# Patient Record
Sex: Female | Born: 1965 | Race: White | Hispanic: No | Marital: Single | Smoking: Never smoker
Health system: Southern US, Community
[De-identification: ages and names within clinical notes are randomized; demographics above are authoritative.]

## PROBLEM LIST (undated history)

## (undated) DIAGNOSIS — R202 Paresthesia of skin: Secondary | ICD-10-CM

## (undated) DIAGNOSIS — R52 Pain, unspecified: Secondary | ICD-10-CM

## (undated) DIAGNOSIS — F329 Major depressive disorder, single episode, unspecified: Secondary | ICD-10-CM

## (undated) DIAGNOSIS — F32A Depression, unspecified: Secondary | ICD-10-CM

## (undated) DIAGNOSIS — E079 Disorder of thyroid, unspecified: Secondary | ICD-10-CM

## (undated) DIAGNOSIS — R200 Anesthesia of skin: Secondary | ICD-10-CM

## (undated) HISTORY — DX: Depression, unspecified: F32.A

## (undated) HISTORY — DX: Paresthesia of skin: R20.0

## (undated) HISTORY — DX: Disorder of thyroid, unspecified: E07.9

## (undated) HISTORY — PX: ABDOMINAL HYSTERECTOMY: SHX81

## (undated) HISTORY — DX: Pain, unspecified: R52

## (undated) HISTORY — DX: Paresthesia of skin: R20.2

## (undated) HISTORY — DX: Major depressive disorder, single episode, unspecified: F32.9

---

## 1998-02-16 ENCOUNTER — Other Ambulatory Visit: Admission: RE | Admit: 1998-02-16 | Discharge: 1998-02-16 | Payer: Self-pay | Admitting: Obstetrics and Gynecology

## 1998-03-18 ENCOUNTER — Other Ambulatory Visit: Admission: RE | Admit: 1998-03-18 | Discharge: 1998-03-18 | Payer: Self-pay | Admitting: Obstetrics and Gynecology

## 1999-05-30 ENCOUNTER — Other Ambulatory Visit: Admission: RE | Admit: 1999-05-30 | Discharge: 1999-05-30 | Payer: Self-pay | Admitting: Obstetrics and Gynecology

## 2000-07-23 ENCOUNTER — Encounter: Payer: Self-pay | Admitting: Emergency Medicine

## 2000-07-23 ENCOUNTER — Emergency Department (HOSPITAL_COMMUNITY): Admission: EM | Admit: 2000-07-23 | Discharge: 2000-07-23 | Payer: Self-pay | Admitting: Emergency Medicine

## 2001-11-25 ENCOUNTER — Encounter: Admission: RE | Admit: 2001-11-25 | Discharge: 2001-11-25 | Payer: Self-pay | Admitting: Family Medicine

## 2001-11-25 ENCOUNTER — Encounter: Payer: Self-pay | Admitting: Family Medicine

## 2002-09-17 ENCOUNTER — Other Ambulatory Visit: Admission: RE | Admit: 2002-09-17 | Discharge: 2002-09-17 | Payer: Self-pay | Admitting: Family Medicine

## 2003-12-04 ENCOUNTER — Other Ambulatory Visit: Admission: RE | Admit: 2003-12-04 | Discharge: 2003-12-04 | Payer: Self-pay | Admitting: Internal Medicine

## 2004-12-30 ENCOUNTER — Other Ambulatory Visit: Admission: RE | Admit: 2004-12-30 | Discharge: 2004-12-30 | Payer: Self-pay | Admitting: Obstetrics and Gynecology

## 2005-02-24 ENCOUNTER — Ambulatory Visit (HOSPITAL_COMMUNITY): Admission: RE | Admit: 2005-02-24 | Discharge: 2005-02-24 | Payer: Self-pay | Admitting: Obstetrics and Gynecology

## 2005-02-24 ENCOUNTER — Ambulatory Visit (HOSPITAL_BASED_OUTPATIENT_CLINIC_OR_DEPARTMENT_OTHER): Admission: RE | Admit: 2005-02-24 | Discharge: 2005-02-24 | Payer: Self-pay | Admitting: Obstetrics and Gynecology

## 2005-04-10 ENCOUNTER — Ambulatory Visit (HOSPITAL_COMMUNITY): Admission: RE | Admit: 2005-04-10 | Discharge: 2005-04-10 | Payer: Self-pay | Admitting: Obstetrics and Gynecology

## 2005-04-10 ENCOUNTER — Ambulatory Visit (HOSPITAL_BASED_OUTPATIENT_CLINIC_OR_DEPARTMENT_OTHER): Admission: RE | Admit: 2005-04-10 | Discharge: 2005-04-10 | Payer: Self-pay | Admitting: Obstetrics and Gynecology

## 2005-05-30 ENCOUNTER — Ambulatory Visit (HOSPITAL_COMMUNITY): Admission: RE | Admit: 2005-05-30 | Discharge: 2005-05-30 | Payer: Self-pay | Admitting: Obstetrics and Gynecology

## 2005-05-30 ENCOUNTER — Ambulatory Visit (HOSPITAL_BASED_OUTPATIENT_CLINIC_OR_DEPARTMENT_OTHER): Admission: RE | Admit: 2005-05-30 | Discharge: 2005-05-30 | Payer: Self-pay | Admitting: Obstetrics and Gynecology

## 2005-08-17 ENCOUNTER — Ambulatory Visit (HOSPITAL_COMMUNITY): Admission: RE | Admit: 2005-08-17 | Discharge: 2005-08-17 | Payer: Self-pay | Admitting: Obstetrics and Gynecology

## 2005-09-29 ENCOUNTER — Ambulatory Visit (HOSPITAL_COMMUNITY): Admission: RE | Admit: 2005-09-29 | Discharge: 2005-09-29 | Payer: Self-pay | Admitting: Obstetrics and Gynecology

## 2005-09-29 ENCOUNTER — Ambulatory Visit (HOSPITAL_BASED_OUTPATIENT_CLINIC_OR_DEPARTMENT_OTHER): Admission: RE | Admit: 2005-09-29 | Discharge: 2005-09-29 | Payer: Self-pay | Admitting: Obstetrics and Gynecology

## 2006-01-26 ENCOUNTER — Other Ambulatory Visit: Admission: RE | Admit: 2006-01-26 | Discharge: 2006-01-26 | Payer: Self-pay | Admitting: Obstetrics and Gynecology

## 2006-05-21 ENCOUNTER — Other Ambulatory Visit: Admission: RE | Admit: 2006-05-21 | Discharge: 2006-05-21 | Payer: Self-pay | Admitting: Obstetrics & Gynecology

## 2007-03-25 ENCOUNTER — Other Ambulatory Visit: Admission: RE | Admit: 2007-03-25 | Discharge: 2007-03-25 | Payer: Self-pay | Admitting: Obstetrics and Gynecology

## 2008-02-17 ENCOUNTER — Emergency Department (HOSPITAL_COMMUNITY): Admission: EM | Admit: 2008-02-17 | Discharge: 2008-02-17 | Payer: Self-pay | Admitting: Emergency Medicine

## 2008-02-27 ENCOUNTER — Emergency Department (HOSPITAL_COMMUNITY): Admission: EM | Admit: 2008-02-27 | Discharge: 2008-02-27 | Payer: Self-pay | Admitting: Family Medicine

## 2008-05-29 ENCOUNTER — Emergency Department (HOSPITAL_COMMUNITY): Admission: EM | Admit: 2008-05-29 | Discharge: 2008-05-29 | Payer: Self-pay | Admitting: Emergency Medicine

## 2009-06-15 ENCOUNTER — Emergency Department (HOSPITAL_COMMUNITY): Admission: EM | Admit: 2009-06-15 | Discharge: 2009-06-16 | Payer: Self-pay | Admitting: Emergency Medicine

## 2010-05-18 ENCOUNTER — Emergency Department (HOSPITAL_COMMUNITY): Admission: EM | Admit: 2010-05-18 | Discharge: 2010-05-18 | Payer: Self-pay | Admitting: Emergency Medicine

## 2010-05-20 ENCOUNTER — Emergency Department (HOSPITAL_COMMUNITY): Admission: EM | Admit: 2010-05-20 | Discharge: 2010-05-20 | Payer: Self-pay | Admitting: Emergency Medicine

## 2010-06-04 ENCOUNTER — Inpatient Hospital Stay (HOSPITAL_COMMUNITY): Admission: AD | Admit: 2010-06-04 | Discharge: 2010-06-04 | Payer: Self-pay | Admitting: Obstetrics and Gynecology

## 2010-06-05 ENCOUNTER — Ambulatory Visit: Payer: Self-pay | Admitting: Gynecology

## 2010-11-05 ENCOUNTER — Encounter: Payer: Self-pay | Admitting: Obstetrics and Gynecology

## 2010-12-30 LAB — URINALYSIS, ROUTINE W REFLEX MICROSCOPIC
Bilirubin Urine: NEGATIVE
Glucose, UA: NEGATIVE mg/dL
Glucose, UA: NEGATIVE mg/dL
Hgb urine dipstick: NEGATIVE
Ketones, ur: NEGATIVE mg/dL
Ketones, ur: NEGATIVE mg/dL
Leukocytes, UA: NEGATIVE
Nitrite: NEGATIVE
Protein, ur: NEGATIVE mg/dL
Protein, ur: NEGATIVE mg/dL
Specific Gravity, Urine: 1.018 (ref 1.005–1.030)
Urobilinogen, UA: 0.2 mg/dL (ref 0.0–1.0)
Urobilinogen, UA: 0.2 mg/dL (ref 0.0–1.0)
pH: 6 (ref 5.0–8.0)

## 2010-12-30 LAB — LIPASE, BLOOD: Lipase: 31 U/L (ref 11–59)

## 2010-12-30 LAB — DIFFERENTIAL
Basophils Absolute: 0 K/uL (ref 0.0–0.1)
Basophils Relative: 0 % (ref 0–1)
Basophils Relative: 1 % (ref 0–1)
Eosinophils Absolute: 0.1 10*3/uL (ref 0.0–0.7)
Eosinophils Relative: 1 % (ref 0–5)
Eosinophils Relative: 1 % (ref 0–5)
Lymphocytes Relative: 23 % (ref 12–46)
Lymphocytes Relative: 27 % (ref 12–46)
Lymphs Abs: 2.9 10*3/uL (ref 0.7–4.0)
Lymphs Abs: 3 10*3/uL (ref 0.7–4.0)
Monocytes Absolute: 0.7 K/uL (ref 0.1–1.0)
Monocytes Relative: 6 % (ref 3–12)
Neutro Abs: 7.2 10*3/uL (ref 1.7–7.7)
Neutrophils Relative %: 66 % (ref 43–77)
Neutrophils Relative %: 71 % (ref 43–77)

## 2010-12-30 LAB — CBC
HCT: 36.7 % (ref 36.0–46.0)
HCT: 37.9 % (ref 36.0–46.0)
Hemoglobin: 12.9 g/dL (ref 12.0–15.0)
Hemoglobin: 13.1 g/dL (ref 12.0–15.0)
MCH: 31.5 pg (ref 26.0–34.0)
MCH: 31.8 pg (ref 26.0–34.0)
MCHC: 35.2 g/dL (ref 30.0–36.0)
MCV: 90.4 fL (ref 78.0–100.0)
MCV: 91.6 fL (ref 78.0–100.0)
Platelets: 295 K/uL (ref 150–400)
RBC: 4.06 MIL/uL (ref 3.87–5.11)
RDW: 13.9 % (ref 11.5–15.5)
WBC: 10.9 K/uL — ABNORMAL HIGH (ref 4.0–10.5)

## 2010-12-30 LAB — URINE CULTURE
Colony Count: 50000
Culture  Setup Time: 201108040246

## 2010-12-30 LAB — COMPREHENSIVE METABOLIC PANEL
Albumin: 4 g/dL (ref 3.5–5.2)
Albumin: 4.3 g/dL (ref 3.5–5.2)
Alkaline Phosphatase: 69 U/L (ref 39–117)
Alkaline Phosphatase: 73 U/L (ref 39–117)
BUN: 12 mg/dL (ref 6–23)
BUN: 14 mg/dL (ref 6–23)
CO2: 23 mEq/L (ref 19–32)
CO2: 28 mEq/L (ref 19–32)
Calcium: 9.2 mg/dL (ref 8.4–10.5)
Calcium: 9.3 mg/dL (ref 8.4–10.5)
Chloride: 104 mEq/L (ref 96–112)
Chloride: 104 mEq/L (ref 96–112)
Creatinine, Ser: 0.76 mg/dL (ref 0.4–1.2)
Creatinine, Ser: 0.8 mg/dL (ref 0.4–1.2)
GFR calc Af Amer: >60 mL/min (ref >60)
GFR calc non Af Amer: >60 mL/min (ref >60)
Potassium: 3.8 mEq/L (ref 3.5–5.1)
Potassium: 4 mEq/L (ref 3.5–5.1)
Sodium: 137 mEq/L (ref 135–145)
Sodium: 138 mEq/L (ref 135–145)
Total Protein: 7.4 g/dL (ref 6.0–8.3)

## 2010-12-30 LAB — COMPREHENSIVE METABOLIC PANEL WITH GFR
ALT: 36 U/L — ABNORMAL HIGH (ref 0–35)
AST: 32 U/L (ref 0–37)
GFR calc Af Amer: >60 mL/min (ref >60)
Glucose, Bld: 83 mg/dL (ref 70–99)
Total Bilirubin: 0.6 mg/dL (ref 0.3–1.2)

## 2010-12-30 LAB — POCT PREGNANCY, URINE: Preg Test, Ur: NEGATIVE

## 2011-03-03 NOTE — Op Note (Signed)
NAME:  [name], [name] NO.:  [id number]   MEDICAL RECORD NO.:  [id number]          PATIENT TYPE:  AMB   LOCATION:  [facility]                         FACILITY:  [facility]   PHYSICIAN:  Cynthia P. Romine, M.D.DATE OF BIRTH:  13-Dec-1965   DATE OF PROCEDURE:  02/24/2005  DATE OF DISCHARGE:                                 OPERATIVE REPORT   PREOPERATIVE DIAGNOSES:  Menorrhagia.   POSTOPERATIVE DIAGNOSES:  Menorrhagia.   PROCEDURE:  Hysteroscopy attempt at HTA unsuccessful.   SURGEON:  Cynthia P. Romine, M.D.   ANESTHESIA:  General by LMA.   ESTIMATED BLOOD LOSS:  25 mL.   COMPLICATIONS:  None.   DESCRIPTION OF PROCEDURE:  The patient was taken to the operating room and  after the induction of adequate general anesthesia was placed in the dorsal  lithotomy position and prepped and draped in the usual fashion. The bladder  was drained with a red rubber catheter. A posterior weighted and anterior  Sims retractor were placed and the cervix was grasped off the anterior lip  with a single tooth tenaculum. The sound wound not pass, it was felt the os  was stenotic. The os finders were used and were passed into the uterus to a  depth of 6 cm. On ultrasound, the uterus had had a length of a total of 7 cm  so it was felt this was an appropriate distance into the uterus to be the  endometrial canal. The opening was dilated to a #25 Shawnie Pons. The hysteroscope  was introduced and the hysteroscope would not pass through the dilated  opening. The hysteroscope was removed, the opening was dilated to a #27, the  hysteroscope was reintroduced. At this time, it could be seen that the  surgeon had created a false passage in the uterus and when the hysteroscope  was introduced to the part of the uterus that had been opened, it was not  the endometrial canal. The hysteroscope was withdrawn to just inside the  external os and it appeared that there were actually two false passages.  Passing the  scope through either one of them did not achieve entry into a  recognizable endometrial cavity. An approximately 30 minute attempt was made  to find the internal cervical os so that the procedure could be accomplished  successfully, however, all attempts were unsuccessful and the surgeon was  never able to enter a recognizable endometrial cavity. Therefore the  procedure was abandoned, the instruments were removed from the vagina and  the procedure was terminated. The patient was taken to the recovery room in  satisfactory condition.      CPR/MEDQ  D:  02/24/2005  T:  02/24/2005  Job:  [id number]

## 2011-03-03 NOTE — Op Note (Signed)
NAME:  Tracie Gonzalez, Tracie Gonzalez NO.:  [id number]   MEDICAL RECORD NO.:  [id number]          PATIENT TYPE:  AMB   LOCATION:  [facility]                         FACILITY:  [facility]   PHYSICIAN:  Cynthia P. Romine, M.D.DATE OF BIRTH:  10-10-1966   DATE OF PROCEDURE:  09/29/2005  DATE OF DISCHARGE:                                 OPERATIVE REPORT   PREOPERATIVE DIAGNOSIS:  Menorrhagia.   POSTOPERATIVE DIAGNOSIS:  Menorrhagia.   PROCEDURE:  Endometrial ablation using the HydroTherm ablator.   SURGEON:  Dr. Arline Asp Romine   ASSISTANT:  None.   ANESTHESIA:  General by LMA.   ESTIMATED BLOOD LOSS:  Minimal.   COMPLICATIONS:  None.   PROCEDURE:  The patient was taken to the operating room and after the  induction of adequate general anesthesia, was placed in the dorsal lithotomy  position.  She had forgotten to take her Cytotec tablet the night before  surgery as directed.  Therefore in order to try and make her cervix easier  to dilate, it was injected with a solution of 10 mL of Pitressin in 10 mL of  saline, and a total of 7.5 mL of that solution was injected into the stroma  of the cervix.  The cervix was then very gently dilated to a #23 Shawnie Pons.  The  scope was introduced, and the endometrial cavity could be seen.  She had an  extremely small endometrial cavity.  No perforation was noted.  Photographic  documentation was taken.  Endometrial ablation was then carried out in the  usual fashion.  Approximately 2 minutes before completion of the procedure,  the machine shut down saying there was a 10 mL loss of 4 mL per minute.  This was felt to be due to expansion of the cavity.  The procedure was  continued.  Upon completing of the procedure, the endometrial cavity was  inspected.  It appeared to have gotten a good ablation.  No perforation was  encountered.  The scope was withdrawn.  The instruments were removed from  the vagina, and the procedure was terminated.  The patient  tolerated it well  and went in satisfactory condition to post anesthesia recovery.           ______________________________  Edwena Felty. Romine, M.D.     CPR/MEDQ  D:  09/29/2005  T:  09/30/2005  Job:  [id number]

## 2011-03-03 NOTE — Op Note (Signed)
NAME:  Tracie Gonzalez, Tracie Gonzalez NO.:  [id number]   MEDICAL RECORD NO.:  [id number]          PATIENT TYPE:  AMB   LOCATION:  [facility]                         FACILITY:  [facility]   PHYSICIAN:  Cynthia P. Romine, M.D.DATE OF BIRTH:  May 23, 1966   DATE OF PROCEDURE:  04/10/2005  DATE OF DISCHARGE:                                 OPERATIVE REPORT   PREOPERATIVE DIAGNOSIS:  Menorrhagia.   POSTOPERATIVE DIAGNOSIS:  Menorrhagia.   PROCEDURE:  Attempt at hydrotherm ablation, complicated by uterine  perforation.   SURGEON:  Cynthia P. Romine, M.D.   ANESTHESIA:  General by LMA.   ESTIMATED BLOOD LOSS:  Minimal.   COMPLICATIONS:  Uterine perforation fundally.   DESCRIPTION OF PROCEDURE:  The patient was taken to the operating room and  after the induction of adequate general anesthesia by LMA, was placed in the  dorsal lithotomy position and prepped and draped in the usual fashion.  The  bladder was drained with a red rubber catheter.  A posterior weighted and  anterior Sims retractor were placed.  The cervix was grasped on the anterior  lip with a single tooth tenaculum.  The patient had used 400 mcg of Cytotec  last night, in an attempt to soften her cervix, since a previous attempt at  HTA had failed, secondary to the inability to enter the cervix because of  cervical stenosis.  Because of the history of cervical stenosis, in addition  to using the Cytotec, I injected approximately 60 mL of a solution of 20  units of Pitressin and 20 units of sterile saline into the stroma of the  ce4rvix, to hopefully allow further softening of the cervix.  The #13 Shawnie Pons  was gently introduced and was noted to pass through the cervix without  difficulty.  The cervix was dilated to a #21 Shawnie Pons.  The hysteroscope that  was used for the HTA was then inserted into the endo-cervix and the fit of  the cervix around this scope was quite snug; however, because I felt that  the cervix was likely to be  softened from the Cytotec and the Pitressin, I  did not want to risk having a leakage of hot fluid around the scope.  Therefore I was happy with the tightness of the fit of the scope.  I watched  the scope as the scope proceeded through the endo-cervical canal, but with  the pressure that was needed to pass the scope through the tight cervix,  once it passed through the endo-cervical canal, it went into the endometrial  cavity and then perforated at the fundus.  Bowel and omentum were seen.  The  procedure was immediately stopped.  The scope was withdrawn.  The opening in  the uterus did not appear to be bleeding at all.  The scope was completely  withdrawn from the uterus and the patient was observed for several  minutes, to see if there was any bleeding from the cervix, which there was  not.  The instruments were removed from the vagina.  The procedure was  terminated.   The patient was taken  to the recovery room in satisfactory condition.       CPR/MEDQ  D:  04/10/2005  T:  04/10/2005  Job:  [id number]

## 2011-03-03 NOTE — Op Note (Signed)
NAME:  Tracie Gonzalez, Tracie Gonzalez NO.:  [id number]   MEDICAL RECORD NO.:  [id number]          PATIENT TYPE:  AMB   LOCATION:  [facility]                         FACILITY:  [facility]   PHYSICIAN:  Cynthia P. Romine, M.D.DATE OF BIRTH:  [birth year]   DATE OF PROCEDURE:  05/30/2005  DATE OF DISCHARGE:                                 OPERATIVE REPORT   PREOPERATIVE DIAGNOSIS:  Menorrhagia.   POSTOPERATIVE DIAGNOSIS:  Menorrhagia.   PROCEDURE:  Failed endometrial ablation.   SURGEON:  Cynthia P. Romine, M.D.   ANESTHESIA:  General by LMA.   ESTIMATED BLOOD LOSS:  None.   COMPLICATIONS:  Fundal perforation.   DESCRIPTION OF PROCEDURE:  The patient was taken to the operating room and  after induction of adequate general anesthesia by LMA, was placed in the  dorsal lithotomy position and prepped and draped in the usual fashion.  She  had voided immediately before surgery, therefore, catheterization was not  done.  The posterior weighted and anterior Sims retractors were placed.  The  uterus was known to be severely anteflexed, so the cervix was grasped on the  posterior lip with the single tooth tenaculum and traction was applied to  straighten the axis of the uterus.  A #13 Pratt dilator was coated with KY  Jelly and very slowly and very gently inserted through the cervix into the  uterus.  As it was inserted into the uterus, it was noted to pass deeply  into the uterus and it was felt that perforation was present.  The cervix  was gently dilated to a #23 Shawnie Pons just so the scope could be introduced.  The hysteroscope was introduced, water was used for a distention medium, and  indeed, at the fundus there was a perforation.  It was not bleeding.  It is  difficult for the operator to imagine that the perforation occurred in the  current setting as slowly and gently as the dilator was introduced, and it  is certainly possible that the perforation had not healed from her previous  attempt  at HEA which was six weeks ago.  The procedure was abandoned, the  instruments were removed from the vagina, and the patient was taken to the  recovery room in good condition.           ______________________________  Edwena Felty. Romine, M.D.     CPR/MEDQ  D:  05/30/2005  T:  05/30/2005  Job:  [id number]

## 2012-02-25 ENCOUNTER — Emergency Department (HOSPITAL_COMMUNITY)
Admission: EM | Admit: 2012-02-25 | Discharge: 2012-02-25 | Disposition: A | Discharge status: Home / Self Care | Payer: BC Managed Care – PPO | Attending: Emergency Medicine | Admitting: Emergency Medicine

## 2012-02-25 ENCOUNTER — Encounter (HOSPITAL_COMMUNITY): Payer: Self-pay | Admitting: Emergency Medicine

## 2012-02-25 DIAGNOSIS — M5430 Sciatica, unspecified side: Secondary | ICD-10-CM | POA: Insufficient documentation

## 2012-02-25 DIAGNOSIS — M5431 Sciatica, right side: Secondary | ICD-10-CM

## 2012-02-25 MED ORDER — OXYCODONE-ACETAMINOPHEN 5-325 MG PO TABS
1.0000 | ORAL_TABLET | Freq: Once | ORAL | Status: AC
  Administered 2012-02-25: 1 via ORAL
  Filled 2012-02-25: qty 1

## 2012-02-25 MED ORDER — HYDROCODONE-ACETAMINOPHEN 5-500 MG PO TABS: 1.0000 | ORAL_TABLET | Freq: Four times a day (QID) | ORAL | Status: DC | PRN

## 2012-02-25 MED ORDER — IBUPROFEN 800 MG PO TABS: 800.0000 mg | ORAL_TABLET | Freq: Three times a day (TID) | ORAL | Status: AC

## 2012-02-25 MED ORDER — IBUPROFEN 800 MG PO TABS
800.0000 mg | ORAL_TABLET | Freq: Once | ORAL | Status: AC
  Administered 2012-02-25: 800 mg via ORAL
  Filled 2012-02-25: qty 1

## 2012-02-25 NOTE — ED Notes (Signed)
Pt denies injury. States pain running down right leg into foot. Pain in lower back.

## 2012-02-25 NOTE — Discharge Instructions (Signed)
Take ibuprofen as directed for inflammation and pain and use hydrocodone-acetaminophen as directed for breakthrough pain but do not drive or operate machinery with pain medication use. Heat for additional relief. Follow up with your primary care provider for recheck of ongoing symptoms in 3-5 days but return to ER for emergent changing or worsening of symptoms.

## 2012-02-25 NOTE — ED Provider Notes (Signed)
History     CSN: [id number]  Arrival date & time 02/25/12  7829   First MD Initiated Contact with Patient 02/25/12 2041      Chief Complaint  Patient presents with  . Back Pain    (Consider location/radiation/quality/duration/timing/severity/associated sxs/prior treatment) HPI  Patient presents to ER complaining of gradual onset right lower back pain with radiation of pain into right hip and leg that began yesterday and has persisted throughout the day today. Patient states that 2 days ago she sat in a hard chair for hours but did not have pain at that time. She states that she woke yesterday with mild pain that is aggravated by prolonged sitting or standing. She denies lower extremity numbness/tingling/weakness, loss of bowel or bladder function or saddle seat paresthesias. She denies fevers, chills, abdominal pain, dysuria, hematuria, blood in stool or difficulty ambulating.   History reviewed. No pertinent past medical history.  Past Surgical History  Procedure Date  . Abdominal hysterectomy     No family history on file.  History  Substance Use Topics  . Smoking status: Never Smoker   . Smokeless tobacco: Not on file  . Alcohol Use: No    OB History    Grav Para Term Preterm Abortions TAB SAB Ect Mult Living                  Review of Systems  All other systems reviewed and are negative.    Allergies  Review of patient's allergies indicates no known allergies.  Home Medications   Current Outpatient Rx  Name Route Sig Dispense Refill  . AMPHETAMINE-DEXTROAMPHETAMINE 30 MG PO TABS Oral Take 30 mg by mouth daily.    . ASPIRIN 325 MG PO TABS Oral Take 325 mg by mouth daily.    . IBUPROFEN 400 MG PO TABS Oral Take 400 mg by mouth every 6 (six) hours as needed. For pain relief    . THYROID 90 MG PO TABS Oral Take 90 mg by mouth daily.    Marland Kitchen HYDROCODONE-ACETAMINOPHEN 5-500 MG PO TABS Oral Take 1-2 tablets by mouth every 6 (six) hours as needed for pain. 15 tablet  0  . IBUPROFEN 800 MG PO TABS Oral Take 1 tablet (800 mg total) by mouth 3 (three) times daily. Take 800mg  by mouth at breakfast, lunch and dinner for the next 5 days 21 tablet 0    BP 133/74  Pulse 84  Temp(Src) 98 F (36.7 C) (Oral)  Resp 16  Wt 175 lb (79.379 kg)  SpO2 99%  Physical Exam  Nursing note and vitals reviewed. Constitutional: She is oriented to person, place, and time. She appears well-developed and well-nourished. No distress.  HENT:  Head: Normocephalic and atraumatic.  Eyes: Conjunctivae are normal.  Neck: Normal range of motion. Neck supple.  Cardiovascular: Normal rate, regular rhythm, normal heart sounds and intact distal pulses.  Exam reveals no gallop and no friction rub.   No murmur heard. Pulmonary/Chest: Effort normal and breath sounds normal. No respiratory distress. She has no wheezes. She has no rales. She exhibits no tenderness.  Abdominal: Soft. Bowel sounds are normal. She exhibits no distension and no mass. There is no tenderness. There is no rebound and no guarding.  Musculoskeletal: Normal range of motion. She exhibits tenderness. She exhibits no edema.       Mild TTP of right lower back without skin changes or crepitous. FROM of bilateral LE with 5/5 strength and normal reflexes.   Neurological: She is  alert and oriented to person, place, and time.  Skin: Skin is warm and dry. No rash noted. She is not diaphoretic. No erythema.  Psychiatric: She has a normal mood and affect.    ED Course  Procedures (including critical care time)  PO percocet and ibuprofen.   Labs Reviewed - No data to display No results found.   1. Sciatica neuralgia, right       MDM  No red flags for back pain with no signs or symptoms of cauda equina. No abdominal pain, fevers, chills or urinary symptoms.         Jenness Corner, [place] 02/25/12 2115

## 2012-02-25 NOTE — ED Provider Notes (Signed)
Medical screening examination/treatment/procedure(s) were performed by non-physician practitioner and as supervising physician I was immediately available for consultation/collaboration. Devoria Albe, MD, Armando Gang   Ward Givens, MD 02/25/12 [phone]

## 2012-02-25 NOTE — ED Notes (Signed)
Pt alert and ambulates without distress 

## 2012-02-25 NOTE — ED Notes (Signed)
Pt alert, nad, c/o low back pain, onset last evening, denies trauma or injury, ambulates to triage, steady gait noted,denies changes in bowel or bladder

## 2012-02-28 ENCOUNTER — Ambulatory Visit (INDEPENDENT_AMBULATORY_CARE_PROVIDER_SITE_OTHER): Payer: BC Managed Care – PPO | Admitting: Family Medicine

## 2012-02-28 ENCOUNTER — Encounter: Payer: Self-pay | Admitting: Family Medicine

## 2012-02-28 ENCOUNTER — Ambulatory Visit: Payer: BC Managed Care – PPO

## 2012-02-28 VITALS — BP 127/89 | HR 98 | Temp 98.7°F | Resp 18 | Ht 60.5 in | Wt 176.6 lb

## 2012-02-28 DIAGNOSIS — M545 Low back pain, unspecified: Secondary | ICD-10-CM

## 2012-02-28 MED ORDER — METHYLPREDNISOLONE 4 MG PO TABS: ORAL_TABLET | ORAL | Status: DC

## 2012-02-28 NOTE — Patient Instructions (Signed)

## 2012-02-28 NOTE — Progress Notes (Signed)
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 She started having pain after sitting down for prolonged period on Saturday. Last week she just embark on a weight training program and had been doing weight lifting.  Patient has had back pain in the past twice, once a year ago, 1 several months ago. In each time she resolved the problem within a couple days with ibuprofen. This is the first time she's had pain running down her leg over the anterior part of her quadriceps.  No problems with urination or defecation. Patient does get diaphoretic at times but has had no fever.  Patient is loathe to take cortisone. She's been on pain pills after going to the emergency room on Sunday and these have only been partially helpful. She's been unable to work this week. He says the pain is worse going up steps stairs and sitting. Cannot lie on anything but a hard floor  Objective: No acute distress the patient appears uncomfortable sitting.  Reflexes: Ankle jerks bilaterally normal, and knee jerks symmetric as well.  SLR: Positive on right  Distal pulses in feet are normal and there is no swelling in the feet., Calves are nontender  Skin: Warm and dry without rash  [facility] reading (PRIMARY) by  Dr. Milus Glazier  L-S spines. Old compression fx L1  Assessment: Sciatica, new onset probably related to activity last week and then prolonged sitting.  Plan: Medrol 4 mg 3-to-to-1-1 daily with food  Out of work this week  May continue her own hydrocodone  MRI tomorrow

## 2012-02-29 ENCOUNTER — Ambulatory Visit
Admission: RE | Admit: 2012-02-29 | Discharge: 2012-02-29 | Disposition: A | Discharge status: Home / Self Care | Payer: BC Managed Care – PPO | Source: Ambulatory Visit | Attending: Family Medicine | Admitting: Family Medicine

## 2012-02-29 DIAGNOSIS — M545 Low back pain: Secondary | ICD-10-CM

## 2012-03-03 ENCOUNTER — Other Ambulatory Visit: Payer: Self-pay | Admitting: Family Medicine

## 2012-03-03 MED ORDER — HYDROCODONE-ACETAMINOPHEN 5-500 MG PO TABS
1.0000 | ORAL_TABLET | Freq: Four times a day (QID) | ORAL | Status: AC | PRN
Start: 2012-03-03 — End: 2012-03-13

## 2012-03-10 ENCOUNTER — Ambulatory Visit (INDEPENDENT_AMBULATORY_CARE_PROVIDER_SITE_OTHER): Payer: BC Managed Care – PPO | Admitting: Family Medicine

## 2012-03-10 VITALS — BP 110/74 | HR 82 | Temp 98.1°F | Resp 16 | Ht 62.5 in | Wt 179.0 lb

## 2012-03-10 DIAGNOSIS — L259 Unspecified contact dermatitis, unspecified cause: Secondary | ICD-10-CM

## 2012-03-10 DIAGNOSIS — L710 Perioral dermatitis: Secondary | ICD-10-CM

## 2012-03-10 DIAGNOSIS — S32008A Other fracture of unspecified lumbar vertebra, initial encounter for closed fracture: Secondary | ICD-10-CM

## 2012-03-10 DIAGNOSIS — S34109A Unspecified injury to unspecified level of lumbar spinal cord, initial encounter: Secondary | ICD-10-CM

## 2012-03-10 DIAGNOSIS — IMO0002 Reserved for concepts with insufficient information to code with codable children: Secondary | ICD-10-CM

## 2012-03-10 MED ORDER — METRONIDAZOLE 1 % EX GEL: Freq: Every day | CUTANEOUS | Status: DC

## 2012-03-10 MED ORDER — METHYLPREDNISOLONE 4 MG PO TABS: ORAL_TABLET | ORAL | Status: DC

## 2012-03-10 NOTE — Progress Notes (Signed)
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  The pain is now 80% better.  She needs a 10 minute break every hour to continue her work.  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  It has worsened the last week O:  Several facial papules right perioral area. P:  metrogel

## 2012-03-17 ENCOUNTER — Encounter: Payer: Self-pay | Admitting: Family Medicine

## 2012-03-17 ENCOUNTER — Ambulatory Visit (INDEPENDENT_AMBULATORY_CARE_PROVIDER_SITE_OTHER): Payer: BC Managed Care – PPO | Admitting: Family Medicine

## 2012-03-17 VITALS — BP 135/89 | HR 80 | Temp 98.4°F | Resp 16 | Ht 62.0 in | Wt 179.2 lb

## 2012-03-17 DIAGNOSIS — S34109A Unspecified injury to unspecified level of lumbar spinal cord, initial encounter: Secondary | ICD-10-CM

## 2012-03-17 DIAGNOSIS — IMO0002 Reserved for concepts with insufficient information to code with codable children: Secondary | ICD-10-CM

## 2012-03-17 DIAGNOSIS — S32008A Other fracture of unspecified lumbar vertebra, initial encounter for closed fracture: Secondary | ICD-10-CM

## 2012-03-17 DIAGNOSIS — L710 Perioral dermatitis: Secondary | ICD-10-CM

## 2012-03-17 MED ORDER — GABAPENTIN 100 MG PO CAPS: 100.0000 mg | ORAL_CAPSULE | Freq: Every day | ORAL | Status: DC

## 2012-03-17 NOTE — Progress Notes (Signed)
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 The pain is now 90% better. She needs a 10 minute break every hour to continue her work.  O: Old film shows L1 compression fx  SLR neg  Diminished AJ bilaterally  Normal sensation legs  Motor: Normal  Able to flex torso 40 degrees and do side bends   A: Compression fracture: Improving P: Gabapentin 100 each bedtime Continue light duty but increased to 8 hours per day with a continued 10 minute rest in shower, start physical therapy 3 times a week for a month and recheck   Also patient has h/o perioral dermatitis which has dramatically improved the MetroGel. Sclerae happy with the loss of papules and erythema around her face. O: Several facial papules right perioral area which are almost nonexistent now. P: metrogel to continue.

## 2012-04-02 ENCOUNTER — Ambulatory Visit (INDEPENDENT_AMBULATORY_CARE_PROVIDER_SITE_OTHER): Payer: BC Managed Care – PPO | Admitting: Emergency Medicine

## 2012-04-02 VITALS — BP 115/76 | HR 80 | Temp 97.9°F | Resp 16 | Ht 62.0 in | Wt 175.8 lb

## 2012-04-02 DIAGNOSIS — IMO0002 Reserved for concepts with insufficient information to code with codable children: Secondary | ICD-10-CM
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
## 2012-05-08 ENCOUNTER — Ambulatory Visit (INDEPENDENT_AMBULATORY_CARE_PROVIDER_SITE_OTHER): Payer: BC Managed Care – PPO | Admitting: Family Medicine

## 2012-05-08 VITALS — BP 132/90 | HR 93 | Temp 98.0°F | Resp 16 | Ht 62.0 in | Wt 172.0 lb

## 2012-05-08 DIAGNOSIS — Z733 Stress, not elsewhere classified: Secondary | ICD-10-CM

## 2012-05-08 DIAGNOSIS — F439 Reaction to severe stress, unspecified: Secondary | ICD-10-CM

## 2012-05-08 DIAGNOSIS — M545 Low back pain, unspecified: Secondary | ICD-10-CM
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
## 2012-05-24 ENCOUNTER — Encounter: Payer: Self-pay | Admitting: Family Medicine

## 2012-05-24 ENCOUNTER — Ambulatory Visit (INDEPENDENT_AMBULATORY_CARE_PROVIDER_SITE_OTHER): Payer: BC Managed Care – PPO | Admitting: Family Medicine

## 2012-05-24 VITALS — BP 118/76 | HR 65 | Temp 98.3°F | Resp 16 | Ht 62.5 in | Wt 175.0 lb

## 2012-05-24 DIAGNOSIS — M8920 Other disorders of bone development and growth, unspecified site: Secondary | ICD-10-CM

## 2012-05-24 DIAGNOSIS — M8580 Other specified disorders of bone density and structure, unspecified site: Secondary | ICD-10-CM

## 2012-05-24 DIAGNOSIS — M545 Low back pain, unspecified: Secondary | ICD-10-CM

## 2012-05-24 NOTE — Progress Notes (Signed)
46 yo with chronic back pain and recently had an MRI in [place].  She is here to review that recent MRI.  The MRI was performed about two weeks ago.   Overall, the pain is lessening.  It is centered in lower back and doesn't radiate into legs.  Objective:  NAD  SLR:  Negative Reflexes:  No asymmetry  Mild abnormalities seen on MRI faxed over from [place] [facility]  1. Low back pain  MR Lumbar Spine Wo Contrast, DG Bone Density  2. Osteopenia  MR Lumbar Spine Wo Contrast, DG Bone Density   Follow up in 1 month

## 2012-05-28 ENCOUNTER — Other Ambulatory Visit: Payer: Self-pay | Admitting: Family Medicine

## 2012-05-28 DIAGNOSIS — IMO0002 Reserved for concepts with insufficient information to code with codable children: Secondary | ICD-10-CM

## 2012-05-28 MED ORDER — GABAPENTIN 100 MG PO CAPS: 100.0000 mg | ORAL_CAPSULE | Freq: Every day | ORAL | Status: DC

## 2012-06-25 ENCOUNTER — Telehealth: Payer: Self-pay

## 2012-06-25 DIAGNOSIS — M8580 Other specified disorders of bone density and structure, unspecified site: Secondary | ICD-10-CM

## 2012-06-25 NOTE — Telephone Encounter (Signed)
Pt states that she has an appt with [facility] for a scan and would like a bone density added to it for same day on 08/01/12 at 930   Best number [phone]

## 2012-06-25 NOTE — Telephone Encounter (Signed)
Dr Milus Glazier, do you want to refer pt to [facility] for a bone density test on 08/01/12 when she goes in for a scan?

## 2012-06-25 NOTE — Telephone Encounter (Signed)
Yes, please schedule DXA scan

## 2012-06-26 NOTE — Telephone Encounter (Signed)
Notified pt that referral for bone density has been started.

## 2012-07-03 ENCOUNTER — Ambulatory Visit (INDEPENDENT_AMBULATORY_CARE_PROVIDER_SITE_OTHER): Payer: BC Managed Care – PPO | Admitting: Emergency Medicine

## 2012-07-03 VITALS — BP 132/82 | HR 88 | Temp 98.4°F | Resp 16 | Ht 62.0 in | Wt 177.0 lb

## 2012-07-03 DIAGNOSIS — F988 Other specified behavioral and emotional disorders with onset usually occurring in childhood and adolescence: Secondary | ICD-10-CM

## 2012-07-03 MED ORDER — AMPHETAMINE-DEXTROAMPHETAMINE 30 MG PO TABS: 30.0000 mg | ORAL_TABLET | Freq: Two times a day (BID) | ORAL | Status: DC

## 2012-07-03 MED ORDER — AMPHETAMINE-DEXTROAMPHETAMINE 30 MG PO TABS: 30.0000 mg | ORAL_TABLET | Freq: Every day | ORAL | Status: DC

## 2012-07-03 NOTE — Progress Notes (Signed)
   Date:  07/03/2012   Name:  Tracie Gonzalez   DOB:  October 11, 1966   MRN:  [id number] Gender: female Age: 46 y.o.  PCP:  No primary provider on file.    Chief Complaint: Medication Refill and referral request   History of Present Illness:  Tracie Gonzalez is a 46 y.o. pleasant patient who presents with the following:  Requests refill of adderall under treatment for ADD at [facility] physicians.  Records are not available as they have not yet been scanned into computer.  Tolerating medication well, sleeping and eating without difficulty.    There is no problem list on file for this patient.   No past medical history on file.  Past Surgical History  Procedure Date  . Abdominal hysterectomy     History  Substance Use Topics  . Smoking status: Never Smoker   . Smokeless tobacco: Not on file  . Alcohol Use: Yes     socially    No family history on file.  No Known Allergies  Medication list has been reviewed and updated.  Outpatient Prescriptions Prior to Visit  Medication Sig Dispense Refill  . amphetamine-dextroamphetamine (ADDERALL) 30 MG tablet Take 30 mg by mouth daily.      Marland Kitchen etodolac (LODINE) 400 MG tablet Take 400 mg by mouth 2 (two) times daily.      Marland Kitchen gabapentin (NEURONTIN) 100 MG capsule Take 1 capsule (100 mg total) by mouth at bedtime. NEEDS OFFICE VISIT  30 capsule  0  . metroNIDAZOLE (METROGEL) 1 % gel Apply topically daily.  45 g  0  . thyroid (ARMOUR) 90 MG tablet Take 90 mg by mouth daily.      . valACYclovir (VALTREX) 1000 MG tablet Take 1,000 mg by mouth as needed.      Marland Kitchen HYDROcodone-acetaminophen (VICODIN) 5-500 MG per tablet Take 1 tablet by mouth every 6 (six) hours as needed.      . methylPREDNISolone (MEDROL) 4 MG tablet 2 daily  6 tablet  0    Review of Systems:  As per HPI, otherwise negative.    Physical Examination: Filed Vitals:   07/03/12 1746  BP: 132/82  Pulse: 88  Temp: 98.4 F (36.9 C)  Resp: 16   Filed Vitals:   07/03/12 1746    Height: 5\' 2"  (1.575 m)  Weight: 177 lb (80.287 kg)   Body mass index is 32.37 kg/(m^2). Ideal Body Weight: Weight in (lb) to have BMI = 25: 136.4    GEN: WDWN, NAD, Non-toxic, Alert & Oriented x 3 HEENT: Atraumatic, Normocephalic.  Ears and Nose: No external deformity. EXTR: No clubbing/cyanosis/edema NEURO: Normal gait.  PSYCH: Normally interactive. Conversant. Not depressed or anxious appearing.  Calm demeanor.    Assessment and Plan: ADD Refill adderall for three months.  Next visit in December.  Carmelina Dane, MD  I have reviewed and agree with documentation. Robert P. Merla Riches, M.D.

## 2012-07-05 ENCOUNTER — Telehealth: Payer: Self-pay

## 2012-07-05 NOTE — Telephone Encounter (Signed)
Patient is requesting to have kykoplasty. Please call [phone] Patient does not care if surgery is in [place]. Patient also needs it done this year for insurance purposes.

## 2012-07-05 NOTE — Telephone Encounter (Signed)
Pt is wanting to talk with dr Milus Glazier about a new referral regarding her back  Best number [phone]

## 2012-07-07 NOTE — Telephone Encounter (Signed)
I won't be able to refer until I have more time to review the chart.  Where does she want referral?

## 2012-07-07 NOTE — Telephone Encounter (Signed)
Left message for patient to call back with the doctor's name where she wants to be referred. Also let her know that it will take a bit to review her chart.

## 2012-07-08 ENCOUNTER — Telehealth: Payer: Self-pay

## 2012-07-08 NOTE — Telephone Encounter (Signed)
Dr Milus Glazier,   Pt said she had seen you a while back about having a kyphoplasti done??? Sorry if spelled wrong. She would like [place] to schedule this in December so her surgery would be free.

## 2012-07-08 NOTE — Telephone Encounter (Signed)
Patient wants to proceed with Kyphoplasty, please advise.

## 2012-07-09 ENCOUNTER — Other Ambulatory Visit: Payer: Self-pay | Admitting: Family Medicine

## 2012-07-09 DIAGNOSIS — M4850XA Collapsed vertebra, not elsewhere classified, site unspecified, initial encounter for fracture: Secondary | ICD-10-CM

## 2012-07-10 ENCOUNTER — Telehealth (HOSPITAL_COMMUNITY): Payer: Self-pay

## 2012-07-10 ENCOUNTER — Telehealth: Payer: Self-pay | Admitting: Radiology

## 2012-07-10 DIAGNOSIS — IMO0002 Reserved for concepts with insufficient information to code with codable children: Secondary | ICD-10-CM

## 2012-07-10 NOTE — Telephone Encounter (Signed)
Another call from radiology, they need order for the procedure. This is done.

## 2012-07-10 NOTE — Telephone Encounter (Signed)
Patient needs Radiology consult prior to kyphoplasty order put in

## 2012-07-10 NOTE — Telephone Encounter (Signed)
Left a message for the pt to call in regards to her consult apt with Dr. Corliss Skains

## 2012-07-15 ENCOUNTER — Ambulatory Visit (HOSPITAL_COMMUNITY)
Admission: RE | Admit: 2012-07-15 | Discharge: 2012-07-15 | Disposition: A | Discharge status: Home / Self Care | Payer: BC Managed Care – PPO | Source: Ambulatory Visit | Attending: Family Medicine | Admitting: Family Medicine

## 2012-07-15 DIAGNOSIS — IMO0002 Reserved for concepts with insufficient information to code with codable children: Secondary | ICD-10-CM

## 2012-08-01 ENCOUNTER — Other Ambulatory Visit: Payer: Self-pay | Admitting: Family Medicine

## 2012-08-01 DIAGNOSIS — M5136 Other intervertebral disc degeneration, lumbar region: Secondary | ICD-10-CM

## 2012-08-01 DIAGNOSIS — N83201 Unspecified ovarian cyst, right side: Secondary | ICD-10-CM

## 2012-08-06 ENCOUNTER — Ambulatory Visit: Payer: BC Managed Care – PPO | Admitting: Gynecology

## 2012-08-07 ENCOUNTER — Telehealth: Payer: Self-pay

## 2012-08-07 NOTE — Telephone Encounter (Signed)
Pt was calling back to find out why we had tried to call her from here a few days ago. She had been transferring messages from one phone to another and saw that we had called at some point. Spoke w/Dr L and Referrals and determined that MRI and bone density results had come back in and Dr L had made some referrals for pt. Referrals had left detailed message on 08/02/12 about appt w/Dr Lily Peer sch for 08/06/12. Dr. Elbert Ewings stated that bone density results came back with no problems indicated and MRI lumbar spine showed DDD w/no significant nerve encroachment, and also showed an ovarian cyst. Dr L referred pt to GYN and Dr Ethelene Hal. MRI results are at [facility]' desk.  Tried to call pt back and her VM has not been set up yet.

## 2012-08-07 NOTE — Telephone Encounter (Signed)
Pt LM on nurse VM that she was returning our call and we should LMOM for her. Tried to call pt back and got message that her "VM has not been set up".

## 2012-08-09 NOTE — Telephone Encounter (Signed)
Pt CB and D/W her the results in more detail and made copies of MRI report for her to p/up to take to specialists. Pt stated that she already has a GYN and we had referred her to an ortho in the past so she will call them and schedule an appt. Notified Referrals that we can cancel our referrals to ortho and GYN.

## 2012-08-09 NOTE — Telephone Encounter (Signed)
VM was set up and I was able to [facility] w/result and referral info (pt had asked that we LM, but had been unable until today). Asked for pt to [facility] for more info on referrals or w/any questions.

## 2012-08-12 ENCOUNTER — Telehealth (HOSPITAL_COMMUNITY): Payer: Self-pay

## 2012-08-12 NOTE — Telephone Encounter (Signed)
I received a vm from the pt in regards to an MRI scan she had.  At this time  Dr. Corliss Skains has not received anything.  i left avm for the pt explaining this and to please have the images and report sent to the office to move forward.

## 2012-08-13 ENCOUNTER — Other Ambulatory Visit (HOSPITAL_COMMUNITY): Payer: Self-pay | Admitting: Interventional Radiology

## 2012-08-13 ENCOUNTER — Telehealth (HOSPITAL_COMMUNITY): Payer: Self-pay

## 2012-08-13 DIAGNOSIS — IMO0002 Reserved for concepts with insufficient information to code with codable children: Secondary | ICD-10-CM

## 2012-08-13 NOTE — Telephone Encounter (Signed)
I left a message for Tracie Gonzalez in regards to setting up an appt with Dr. Corliss Skains.

## 2012-08-23 ENCOUNTER — Ambulatory Visit (HOSPITAL_COMMUNITY)
Admission: RE | Admit: 2012-08-23 | Discharge: 2012-08-23 | Disposition: A | Discharge status: Home / Self Care | Payer: BC Managed Care – PPO | Source: Ambulatory Visit | Attending: Interventional Radiology | Admitting: Interventional Radiology

## 2012-08-23 DIAGNOSIS — IMO0002 Reserved for concepts with insufficient information to code with codable children: Secondary | ICD-10-CM

## 2012-10-01 ENCOUNTER — Ambulatory Visit (INDEPENDENT_AMBULATORY_CARE_PROVIDER_SITE_OTHER): Payer: BC Managed Care – PPO | Admitting: Family Medicine

## 2012-10-01 VITALS — BP 123/77 | HR 81 | Temp 98.4°F | Resp 18 | Wt 185.0 lb

## 2012-10-01 DIAGNOSIS — F988 Other specified behavioral and emotional disorders with onset usually occurring in childhood and adolescence: Secondary | ICD-10-CM

## 2012-10-01 DIAGNOSIS — M549 Dorsalgia, unspecified: Secondary | ICD-10-CM

## 2012-10-01 MED ORDER — AMPHETAMINE-DEXTROAMPHETAMINE 30 MG PO TABS: 30.0000 mg | ORAL_TABLET | Freq: Two times a day (BID) | ORAL | Status: DC

## 2012-10-01 NOTE — Progress Notes (Signed)
46 yo with chronic back pain, responding well to acupuncture.  She says the tailbone rarely bothers her.  She also needs refills on her adderall.  Objective:  NAD SLR:  Negative Reflexes:  Normal 2+ and symmetric AJ and KJ Back is mildly tender.  Assessment:  1. ADD (attention deficit disorder)  amphetamine-dextroamphetamine (ADDERALL) 30 MG tablet, DISCONTINUED: amphetamine-dextroamphetamine (ADDERALL) 30 MG tablet, DISCONTINUED: amphetamine-dextroamphetamine (ADDERALL) 30 MG tablet, DISCONTINUED: amphetamine-dextroamphetamine (ADDERALL) 30 MG tablet, DISCONTINUED: amphetamine-dextroamphetamine (ADDERALL) 30 MG tablet, DISCONTINUED: amphetamine-dextroamphetamine (ADDERALL) 30 MG tablet   Continue the acupuncture

## 2012-10-10 ENCOUNTER — Encounter: Payer: Self-pay | Admitting: *Deleted

## 2012-10-16 DIAGNOSIS — Z0271 Encounter for disability determination: Secondary | ICD-10-CM

## 2012-11-13 ENCOUNTER — Ambulatory Visit (INDEPENDENT_AMBULATORY_CARE_PROVIDER_SITE_OTHER): Payer: BC Managed Care – PPO | Admitting: Family Medicine

## 2012-11-13 VITALS — BP 123/83 | HR 85 | Temp 98.2°F | Resp 20 | Ht 62.0 in | Wt 180.0 lb

## 2012-11-13 DIAGNOSIS — G5600 Carpal tunnel syndrome, unspecified upper limb: Secondary | ICD-10-CM

## 2012-11-13 DIAGNOSIS — J40 Bronchitis, not specified as acute or chronic: Secondary | ICD-10-CM

## 2012-11-13 MED ORDER — HYDROCODONE-HOMATROPINE 5-1.5 MG/5ML PO SYRP: 5.0000 mL | ORAL_SOLUTION | Freq: Three times a day (TID) | ORAL | Status: DC | PRN

## 2012-11-13 MED ORDER — AZITHROMYCIN 250 MG PO TABS: ORAL_TABLET | ORAL | Status: DC

## 2012-11-13 MED ORDER — ETODOLAC 400 MG PO TABS: 400.0000 mg | ORAL_TABLET | Freq: Two times a day (BID) | ORAL | Status: DC

## 2012-11-13 NOTE — Progress Notes (Signed)
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  She also developed bilateral carpal tunnel symptoms after scrubbing a rub with a brush recently.  Fingers are tingling and feeling somewhat numb. The cough is nonproductive.  She is having some migratory bee sting type pains in shoulders and lower extremities.  Objective:  NAD TM's:  Wrinkled, retracted left side with bubles Oroph: clear Chest: faint wheezes, no rales Heart:  Reg, no murmur Ext:  No edema Skin: no rash.  Assessment:  Bronchitis and carpal tunnel syndrome, both mild to moderate  Plan: OOW for 5 days Z pak etodolac

## 2012-11-20 ENCOUNTER — Telehealth: Payer: Self-pay

## 2012-11-20 NOTE — Telephone Encounter (Signed)
Pt c °

## 2012-11-20 NOTE — Telephone Encounter (Signed)
lmom to cb. 

## 2012-11-20 NOTE — Telephone Encounter (Signed)
Pt called stated was seen recent by Dr L. & not feeling any better. Please give pt  Call back to discuss. [phone]

## 2012-11-21 NOTE — Telephone Encounter (Signed)
She states her ear is better but still has chest congestion. She has just finished Z pack, I advised her this stays in her system for several more days after she finishes the course. She is advised to try mucinex bid for 1 week, if this is not helpful, or if she gets worse or develops a fever, she should return to clinic. FYI

## 2012-11-21 NOTE — Telephone Encounter (Signed)
I agree pt should use Mucinex for 4-5 more days.  If she is having SOB or wheezing RTC now.

## 2012-11-22 NOTE — Telephone Encounter (Signed)
Patient aware.

## 2012-11-29 ENCOUNTER — Ambulatory Visit (INDEPENDENT_AMBULATORY_CARE_PROVIDER_SITE_OTHER): Payer: BC Managed Care – PPO | Admitting: Physician Assistant

## 2012-11-29 ENCOUNTER — Ambulatory Visit: Payer: BC Managed Care – PPO

## 2012-11-29 VITALS — BP 108/73 | HR 82 | Temp 98.1°F | Resp 16 | Ht 63.0 in | Wt 175.0 lb

## 2012-11-29 DIAGNOSIS — J019 Acute sinusitis, unspecified: Secondary | ICD-10-CM

## 2012-11-29 DIAGNOSIS — R05 Cough: Secondary | ICD-10-CM

## 2012-11-29 DIAGNOSIS — H6982 Other specified disorders of Eustachian tube, left ear: Secondary | ICD-10-CM

## 2012-11-29 DIAGNOSIS — J40 Bronchitis, not specified as acute or chronic: Secondary | ICD-10-CM

## 2012-11-29 DIAGNOSIS — E039 Hypothyroidism, unspecified: Secondary | ICD-10-CM | POA: Insufficient documentation

## 2012-11-29 DIAGNOSIS — H6980 Other specified disorders of Eustachian tube, unspecified ear: Secondary | ICD-10-CM

## 2012-11-29 LAB — POCT CBC
Lymph, poc: 3.6 — AB (ref 0.6–3.4)
MCH, POC: 29.1 pg (ref 27–31.2)
MCHC: 32.9 g/dL (ref 31.8–35.4)
MID (cbc): 0.7 (ref 0–0.9)
MPV: 7.7 fL (ref 0–99.8)
POC LYMPH PERCENT: 32.5 %L (ref 10–50)
POC MID %: 5.9 %M (ref 0–12)
Platelet Count, POC: 388 10*3/uL (ref 142–424)
RDW, POC: 13.9 %
WBC: 11.2 10*3/uL — AB (ref 4.6–10.2)

## 2012-11-29 MED ORDER — TRIAMCINOLONE ACETONIDE(NASAL) 55 MCG/ACT NA INHA: 2.0000 | Freq: Every day | NASAL | Status: DC

## 2012-11-29 MED ORDER — HYDROCODONE-HOMATROPINE 5-1.5 MG/5ML PO SYRP: 5.0000 mL | ORAL_SOLUTION | Freq: Three times a day (TID) | ORAL | Status: DC | PRN

## 2012-11-29 MED ORDER — GUAIFENESIN ER 1200 MG PO TB12: 1.0000 | ORAL_TABLET | Freq: Two times a day (BID) | ORAL | Status: DC

## 2012-11-29 MED ORDER — AMOXICILLIN 875 MG PO TABS: 875.0000 mg | ORAL_TABLET | Freq: Two times a day (BID) | ORAL | Status: DC

## 2012-11-29 NOTE — Progress Notes (Signed)
[address], [place] [address]   Phone [phone]  Subjective:    Patient ID: Tracie Gonzalez, female    DOB: 05-Jul-1966, 47 y.o.   MRN: [id number]  HPI  Pt presents to clinic with continued cough and weakness.  She was seen on 1/29 and given a Zapck and felt like it helped but not 100%.  She has some sinus congestion without rhinorrhea, feels like she is talking funny.  She has had chills but no fever.  Some irritated throat with cough, ? Some PND.  She has no cough at night, she does not feel like the cough is from PND and a tickle in her throat, she fees like the cough is deep and feels like it is coming from deep congestion though her cough is now dry.  She took about 4 day h/o generic mucinex and got some mucus up but since she has stopped she is not improved.  She is not a smoker and has no h/o asthma.  She does not feel like she is SOB.  L ear is really hurting and feels muffled.  Review of Systems  Constitutional: Positive for chills. Negative for fever.  HENT: Positive for congestion and postnasal drip. Negative for sore throat and rhinorrhea.   Respiratory: Positive for cough. Negative for shortness of breath.   Gastrointestinal: Negative for nausea, vomiting and diarrhea.  Musculoskeletal: Negative for myalgias.  Neurological: Positive for weakness (with activity like walking her dogs.).       Objective:   Physical Exam  Vitals reviewed. Constitutional: She is oriented to person, place, and time. She appears well-developed and well-nourished.  HENT:  Head: Normocephalic and atraumatic.  Right Ear: Hearing, tympanic membrane, external ear and ear canal normal.  Left Ear: Hearing, external ear and ear canal normal. Tympanic membrane is retracted.  Nose: Mucosal edema (pale and swollen) present.  Mouth/Throat: Uvula is midline, oropharynx is clear and moist and mucous membranes are normal.  Eyes: Conjunctivae are normal.  Cardiovascular: Normal rate, regular rhythm and  normal heart sounds.   Pulmonary/Chest: Effort normal. She has no wheezes.  Neurological: She is alert and oriented to person, place, and time.  Skin: Skin is warm and dry.  Psychiatric: She has a normal mood and affect. Her behavior is normal. Judgment and thought content normal.   Results for orders placed in visit on 11/29/12  POCT CBC      Result Value Range   WBC 11.2 (*) 4.6 - 10.2 K/uL   Lymph, poc 3.6 (*) 0.6 - 3.4   POC LYMPH PERCENT 32.5  10 - 50 %L   MID (cbc) 0.7  0 - 0.9   POC MID % 5.9  0 - 12 %M   POC Granulocyte 6.9  2 - 6.9   Granulocyte percent 61.6  37 - 80 %G   RBC 4.95  4.04 - 5.48 M/uL   Hemoglobin 14.4  12.2 - 16.2 g/dL   HCT, POC 46.9  62.9 - 47.9 %   MCV 88.4  80 - 97 fL   MCH, POC 29.1  27 - 31.2 pg   MCHC 32.9  31.8 - 35.4 g/dL   RDW, POC 52.8     Platelet Count, POC 388  142 - 424 K/uL   MPV 7.7  0 - 99.8 fL   [facility] reading (PRIMARY) by  Dr. Cleta Alberts.  Normal CXR.      Assessment & Plan:  Cough - Plan: POCT CBC, DG Chest 2  View, Guaifenesin (MUCINEX MAXIMUM STRENGTH) 1200 MG TB12  Bronchitis - Plan: HYDROcodone-homatropine (HYCODAN) 5-1.5 MG/5ML syrup  Sinusitis, acute - Plan: amoxicillin (AMOXIL) 875 MG tablet, Guaifenesin (MUCINEX MAXIMUM STRENGTH) 1200 MG TB12  ETD (eustachian tube dysfunction), left - Plan: triamcinolone (NASACORT AQ) 55 MCG/ACT nasal inhaler  I think her cough is related to sinus infection but will refill her cough medication to help with her symptoms.  Due to the nasal congestion and time with ETD and PND will treat for sinus infection and expect cough to improve as PND resolves, esp with normal CXR.  She should push fluids.  Tylenol/motrin prn for HAs.

## 2012-12-09 ENCOUNTER — Telehealth: Payer: Self-pay

## 2012-12-09 NOTE — Telephone Encounter (Signed)
PT HAS BEEN ON 3 DIFFERENT ANTIBIOTICS SINCE January AND HAS WHAT SHE THINKS IS A YEAST INFECTION. PT WOULD LIKE A RX CALLED TO HER PHARMACY TO HELP WITH THIS ISSUE.   [phone]

## 2012-12-10 MED ORDER — FLUCONAZOLE 150 MG PO TABS: 150.0000 mg | ORAL_TABLET | Freq: Once | ORAL | Status: DC

## 2012-12-10 NOTE — Telephone Encounter (Signed)
[facility] [address]   Please call patient at [phone]

## 2012-12-10 NOTE — Telephone Encounter (Signed)
Sent to [facility] [address] Called patient left message to advise.

## 2012-12-12 ENCOUNTER — Ambulatory Visit (INDEPENDENT_AMBULATORY_CARE_PROVIDER_SITE_OTHER): Payer: BC Managed Care – PPO | Admitting: Physician Assistant

## 2012-12-12 VITALS — BP 99/67 | HR 106 | Temp 99.1°F | Resp 30 | Ht 63.0 in | Wt 175.0 lb

## 2012-12-12 DIAGNOSIS — R05 Cough: Secondary | ICD-10-CM

## 2012-12-12 DIAGNOSIS — J45909 Unspecified asthma, uncomplicated: Secondary | ICD-10-CM

## 2012-12-12 DIAGNOSIS — R110 Nausea: Secondary | ICD-10-CM

## 2012-12-12 DIAGNOSIS — R062 Wheezing: Secondary | ICD-10-CM

## 2012-12-12 DIAGNOSIS — IMO0001 Reserved for inherently not codable concepts without codable children: Secondary | ICD-10-CM

## 2012-12-12 LAB — POCT CBC
Granulocyte percent: 84.4 %G — AB (ref 37–80)
HCT, POC: 38.9 % (ref 37.7–47.9)
MCH, POC: 28.5 pg (ref 27–31.2)
MCV: 88 fL (ref 80–97)
MID (cbc): 0.6 (ref 0–0.9)
RBC: 4.42 M/uL (ref 4.04–5.48)
WBC: 11.3 10*3/uL — AB (ref 4.6–10.2)

## 2012-12-12 MED ORDER — ONDANSETRON 4 MG PO TBDP: 4.0000 mg | ORAL_TABLET | Freq: Three times a day (TID) | ORAL | Status: DC | PRN

## 2012-12-13 NOTE — Progress Notes (Signed)
[address], [place] [address]   Phone [phone]   Subjective:    Patient ID: Tracie Gonzalez, female    DOB: 11-Jul-1966, 47 y.o.   MRN: [id number]  HPI  Pt presents to clinic with all day of feeling poorly.  She woke up queasy and it has only improved today with Pepto.  She is not having abd cramping or diarrhea and has not vomiting but is achy all over and feels weak.  She has been around a lot of people at work with the GI bug.  She has felt feverish today but has had no documented fevers.  She has no cold symptoms.  She will need a note for work for 3 days because of the "point" system at work.   Review of Systems  HENT: Positive for ear pain (only a little better from her last visit). Negative for congestion, sore throat and rhinorrhea.   Gastrointestinal: Positive for nausea. Negative for vomiting, abdominal pain, diarrhea and constipation.  Genitourinary: Negative for dysuria, urgency and frequency.  Neurological: Positive for weakness.       Objective:   Physical Exam  Vitals reviewed. Constitutional: She is oriented to person, place, and time. She appears well-developed and well-nourished.  Pt appears that she does not feel well.  HENT:  Head: Normocephalic and atraumatic.  Right Ear: External ear normal.  Left Ear: External ear normal.  Mouth/Throat: Oropharynx is clear and moist.  Neck: Neck supple.  Cardiovascular: Normal rate, regular rhythm and normal heart sounds.   Pulmonary/Chest: Effort normal and breath sounds normal.  Abdominal: Soft. Bowel sounds are normal. She exhibits no distension. There is no tenderness. There is no rebound.  Neurological: She is alert and oriented to person, place, and time.  Skin: Skin is warm and dry.  Psychiatric: She has a normal mood and affect. Her behavior is normal. Judgment and thought content normal.   Results for orders placed in visit on 12/12/12  POCT CBC      Result Value Range   WBC 11.3 (*) 4.6 - 10.2 K/uL   Lymph, poc 1.2  0.6 - 3.4   POC LYMPH PERCENT 10.3  10 - 50 %L   MID (cbc) 0.6  0 - 0.9   POC MID % 5.3  0 - 12 %M   POC Granulocyte 9.5 (*) 2 - 6.9   Granulocyte percent 84.4 (*) 37 - 80 %G   RBC 4.42  4.04 - 5.48 M/uL   Hemoglobin 12.6  12.2 - 16.2 g/dL   HCT, POC 28.4  13.2 - 47.9 %   MCV 88.0  80 - 97 fL   MCH, POC 28.5  27 - 31.2 pg   MCHC 32.4  31.8 - 35.4 g/dL   RDW, POC 44.0     Platelet Count, POC 254  142 - 424 K/uL   MPV 7.8  0 - 99.8 fL       Assessment & Plan:  Myalgia and myositis - Plan: POCT CBC  Nausea alone - Plan: ondansetron (ZOFRAN ODT) 4 MG disintegrating tablet  Even with mildly elevated WBC this is most likely viral in etiology and pt should do symptomatic care and she should see improvement in 1-2 days.  We discussed RTC if worsening symptoms development of abd pain or uncontrolled vomiting.  She understands and agrees with the treatment plan.  At one of her f/u when she is well I think we should do a CBC to make sure of baseline  for her.

## 2013-03-12 ENCOUNTER — Encounter: Payer: Self-pay | Admitting: Family Medicine

## 2013-03-20 ENCOUNTER — Telehealth: Payer: Self-pay

## 2013-03-20 NOTE — Telephone Encounter (Signed)
Pt wants to know if needs to be seen for next refill or if it can be called in.  [phone]

## 2013-03-21 NOTE — Telephone Encounter (Signed)
LMOM to CB to let us know which medication she is referring to.

## 2013-03-21 NOTE — Telephone Encounter (Signed)
Pt is referring to adderall 30 mg twice a day. Best# [phone]

## 2013-03-22 ENCOUNTER — Other Ambulatory Visit: Payer: Self-pay | Admitting: Radiology

## 2013-03-22 DIAGNOSIS — F988 Other specified behavioral and emotional disorders with onset usually occurring in childhood and adolescence: Secondary | ICD-10-CM

## 2013-03-22 MED ORDER — AMPHETAMINE-DEXTROAMPHETAMINE 30 MG PO TABS: 30.0000 mg | ORAL_TABLET | Freq: Two times a day (BID) | ORAL | Status: AC

## 2013-03-22 NOTE — Telephone Encounter (Signed)
She will need to pick this up at the clinic since we cannot call it in.  I will write for one month only.   I won't be able to continue filling adderall because it is a controlled substance and needs to be filled by someone more familiar with this in adults.  She can see Dr. Merla Riches or Dr. Tora Duck at [facility].

## 2013-03-22 NOTE — Telephone Encounter (Signed)
Dr Milus Glazier has given patient a Rx for Adderall 30, hand written,  I have put in computer and shredded computer Rx since he has given the written Rx to he.r

## 2013-03-24 NOTE — Telephone Encounter (Signed)
Patient advised.

## 2013-04-02 ENCOUNTER — Encounter: Payer: Self-pay | Admitting: Family Medicine

## 2013-04-02 ENCOUNTER — Ambulatory Visit (INDEPENDENT_AMBULATORY_CARE_PROVIDER_SITE_OTHER): Payer: BC Managed Care – PPO | Admitting: Family Medicine

## 2013-04-02 VITALS — BP 116/72 | HR 70 | Temp 97.9°F | Resp 17 | Ht 63.0 in | Wt 170.0 lb

## 2013-04-02 DIAGNOSIS — M545 Low back pain, unspecified: Secondary | ICD-10-CM

## 2013-04-02 DIAGNOSIS — L710 Perioral dermatitis: Secondary | ICD-10-CM

## 2013-04-02 DIAGNOSIS — B009 Herpesviral infection, unspecified: Secondary | ICD-10-CM

## 2013-04-02 DIAGNOSIS — L719 Rosacea, unspecified: Secondary | ICD-10-CM

## 2013-04-02 MED ORDER — SULFAMETHOXAZOLE-TRIMETHOPRIM 800-160 MG PO TABS: 1.0000 | ORAL_TABLET | Freq: Two times a day (BID) | ORAL | Status: DC

## 2013-04-02 MED ORDER — VALACYCLOVIR HCL 1 G PO TABS: ORAL_TABLET | ORAL | Status: DC

## 2013-04-02 MED ORDER — MELOXICAM 7.5 MG PO TABS: 7.5000 mg | ORAL_TABLET | Freq: Every day | ORAL | Status: DC

## 2013-04-02 MED ORDER — CYCLOBENZAPRINE HCL 10 MG PO TABS: 10.0000 mg | ORAL_TABLET | Freq: Two times a day (BID) | ORAL | Status: DC | PRN

## 2013-04-02 NOTE — Progress Notes (Signed)
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  She has intermittent back pain for years.  Two to three weeks ago she was doing yard work and the back has flared.  Some associated nausea this am.  No bowel or bladder sx.  No fever  Patient started having a breakout on her face two weeks ago.  She has had this before in perioral distribution  And usually this responds to minocin.  She's had no improvement in her back pain with OTC's    Objective:  NAD 5 or so areas of blemishes on face SLR negative Good leg ROM Back nontender    Assessment:  Intermittent, recurrent back pain, perioral dermatitis.  Plan:  Patient to supply [place] forms for work so she can have 2-3 days intermittently for the back.

## 2013-05-27 ENCOUNTER — Encounter: Payer: Self-pay | Admitting: Family Medicine

## 2013-06-13 ENCOUNTER — Encounter: Payer: Self-pay | Admitting: Family Medicine

## 2013-07-04 ENCOUNTER — Telehealth: Payer: Self-pay

## 2013-07-04 NOTE — Telephone Encounter (Signed)
Patient states she was given a muscle spasm medication (did not know the name) for her back and she is now experiencing knee and calf pain. Would like to know if she can increase the dosage amount of this medication. [phone]

## 2013-07-04 NOTE — Telephone Encounter (Signed)
Spoke with pt, Flexeril is the muscle relaxer she is referring to. I advised her not to take more than prescribed. I also advised her to recheck due to the severe cramping in her legs. Pt understood and will RTC to clinic.

## 2013-07-05 ENCOUNTER — Ambulatory Visit (INDEPENDENT_AMBULATORY_CARE_PROVIDER_SITE_OTHER): Payer: BC Managed Care – PPO | Admitting: Family Medicine

## 2013-07-05 VITALS — BP 132/84 | HR 86 | Temp 99.0°F | Resp 17 | Ht 62.5 in | Wt 183.0 lb

## 2013-07-05 DIAGNOSIS — IMO0001 Reserved for inherently not codable concepts without codable children: Secondary | ICD-10-CM

## 2013-07-05 LAB — POCT CBC
HCT, POC: 40.3 % (ref 37.7–47.9)
Hemoglobin: 13 g/dL (ref 12.2–16.2)
Lymph, poc: 2.7 (ref 0.6–3.4)
MCH, POC: 29.9 pg (ref 27–31.2)
MCHC: 32.3 g/dL (ref 31.8–35.4)
POC MID %: 6.6 %M (ref 0–12)
WBC: 10.7 10*3/uL — AB (ref 4.6–10.2)

## 2013-07-05 NOTE — Patient Instructions (Addendum)
Muscle Cramps  Muscle cramps are due to sudden involuntary muscle contraction. This means you have no control over the tightening of a muscle (or muscles). Often there are no obvious causes. Muscle cramps may occur with overexertion. They may also occur with chilling of the muscles. An example of a muscle chilling activity is swimming. It is uncommon for cramps to be due to a serious underlying disorder. In most cases, muscle cramps improve (or leave) within minutes.  CAUSES   Some common causes are:   Injury.   Infections, especially viral.   Abnormal levels of the salts and ions in your blood (electrolytes). This could happen if you are taking water pills (diuretics).   Blood vessel disease where not enough blood is getting to the muscles (intermittent claudication).  Some uncommon causes are:   Side effects of some medicine (such as lithium).   Alcohol abuse.   Diseases where there is soreness (inflammation) of the muscular system.  HOME CARE INSTRUCTIONS    It may be helpful to massage, stretch, and relax the affected muscle.   Taking a dose of over-the-counter diphenhydramine is helpful for night leg cramps.  SEEK MEDICAL CARE IF:   Cramps are frequent and not relieved with medicine.  MAKE SURE YOU:    Understand these instructions.   Will watch your condition.   Will get help right away if you are not doing well or get worse.  Document Released: 03/24/2002 Document Revised: 12/25/2011 Document Reviewed: 09/23/2008  ExitCare Patient Information 2014 [employer].

## 2013-07-05 NOTE — Progress Notes (Signed)
Subjective:    Patient ID: Tracie Gonzalez, female    DOB: 1966/09/02, 47 y.o.   MRN: [id number] Chief Complaint  Patient presents with  . Leg Pain   HPI  For the past 3d has had cramping in her both calves with pins and tingles. Above ankle but below knee cap and today had some in her left buttock. Has to bend knee to relieve tingling sensation but best resolved when she sits down and rests.  Only comes on when she is outside walking - she is fine if she is in her apt.  She has been walking back and forth in the exam room for 30 minutes while she was waiting to try to induce sxs but couldn't.  Has started walking up a hill for exercise with her dogs as it is easier on her back. She has some chronic back pain that cause heat sensation through her back but this does not seem related.  Tried heating pad, flexeril, bananas, beans, mustard without benefit as she heard all of these could be used for muscle cramps. But unlike nml muscle cramps/charley horse, calf muscle not seeming to cramp or lock - this sensation is distinctly different, no pulling up or actually cramping at all.. Doing a magnesium and zinc supplement at night which helps her chronic back pain/leg cramps but has not seemed to benefit these. Followed here by Dr. Milus Glazier - has been on bid adderral same dose for years so aware that this can cause muscle cramps but doesn't think it has anything to do with her sxs. Also has an integrative medicine dr in [place] who is correcting her hormone levels.  Past Medical History  Diagnosis Date  . Depression   . Thyroid disease    Current Outpatient Prescriptions on File Prior to Visit  Medication Sig Dispense Refill  . amphetamine-dextroamphetamine (ADDERALL) 30 MG tablet Take 1 tablet (30 mg total) by mouth 2 (two) times daily. Do not fill before 08/02/2012  60 tablet  0  . valACYclovir (VALTREX) 1000 MG tablet Two tablets twice daily for acute flare  40 tablet  10   No current  facility-administered medications on file prior to visit.   No Known Allergies   Review of Systems  Constitutional: Positive for activity change. Negative for fever, chills and unexpected weight change.  Musculoskeletal: Positive for myalgias and gait problem. Negative for back pain, joint swelling and arthralgias.  Skin: Negative for color change and rash.  Neurological: Positive for numbness. Negative for weakness.      BP 132/84  Pulse 86  Temp(Src) 99 F (37.2 C) (Oral)  Resp 17  Ht 5' 2.5" (1.588 m)  Wt 183 lb (83.008 kg)  BMI 32.92 kg/m2  SpO2 99% Objective:   Physical Exam  Constitutional: She is oriented to person, place, and time. She appears well-developed and well-nourished. No distress.  HENT:  Head: Normocephalic and atraumatic.  Right Ear: External ear normal.  Left Ear: External ear normal.  Eyes: Conjunctivae are normal. No scleral icterus.  Neck: Normal range of motion. Neck supple. No thyromegaly present.  Cardiovascular: Normal rate, regular rhythm, normal heart sounds and intact distal pulses.   Pulmonary/Chest: Effort normal and breath sounds normal. No respiratory distress.  Musculoskeletal: Normal range of motion. She exhibits no edema and no tenderness.       Right lower leg: Normal. She exhibits no tenderness, no bony tenderness, no swelling and no edema.       Left lower leg: Normal.  She exhibits no tenderness, no bony tenderness, no swelling and no edema.  Lymphadenopathy:    She has no cervical adenopathy.  Neurological: She is alert and oriented to person, place, and time.  Skin: Skin is warm and dry. She is not diaphoretic. No erythema.  Psychiatric: She has a normal mood and affect. Her behavior is normal.          Assessment & Plan:  Myalgia and myositis - Plan: POCT CBC, POCT SEDIMENTATION RATE, POCT glucose (manual entry), CK, Comprehensive metabolic panel, C-reactive protein, TSH, [place] Lower Ext Art Bilat, T3, Free, T4, Free, POCT  SEDIMENTATION RATE, POCT CBC, Comprehensive metabolic panel, CK, POCT UA - Microscopic Only, POCT urinalysis dipstick Unknown cause - check labs and arterial US as sxs atypical but could be claudication-type.  No trial of medicaiton at this time as problems is self-resolving - resolves within sev minutes of stretching/resting.

## 2013-07-06 ENCOUNTER — Other Ambulatory Visit (INDEPENDENT_AMBULATORY_CARE_PROVIDER_SITE_OTHER): Payer: BC Managed Care – PPO | Admitting: Family Medicine

## 2013-07-06 DIAGNOSIS — IMO0001 Reserved for inherently not codable concepts without codable children: Secondary | ICD-10-CM

## 2013-07-06 LAB — TSH: TSH: 0.027 u[IU]/mL — ABNORMAL LOW (ref 0.350–4.500)

## 2013-07-06 LAB — POCT CBC
Hemoglobin: 13.3 g/dL (ref 12.2–16.2)
MCH, POC: 29.5 pg (ref 27–31.2)
MCV: 92.2 fL (ref 80–97)
MID (cbc): 0.7 (ref 0–0.9)
MPV: 8.4 fL (ref 0–99.8)
POC Granulocyte: 7.1 — AB (ref 2–6.9)
POC MID %: 6.1 %M (ref 0–12)
Platelet Count, POC: 264 10*3/uL (ref 142–424)
RBC: 4.51 M/uL (ref 4.04–5.48)
WBC: 10.8 10*3/uL — AB (ref 4.6–10.2)

## 2013-07-06 LAB — POCT URINALYSIS DIPSTICK
Blood, UA: NEGATIVE
Glucose, UA: NEGATIVE
Ketones, UA: NEGATIVE
Protein, UA: NEGATIVE
Spec Grav, UA: 1.01
Urobilinogen, UA: 0.2
pH, UA: 7

## 2013-07-06 LAB — C-REACTIVE PROTEIN: CRP: 0.5 mg/dL (ref <0.60)

## 2013-07-06 LAB — POCT UA - MICROSCOPIC ONLY
Bacteria, U Microscopic: NEGATIVE
Casts, Ur, LPF, POC: NEGATIVE
Crystals, Ur, HPF, POC: NEGATIVE
Mucus, UA: NEGATIVE
Yeast, UA: NEGATIVE

## 2013-07-06 LAB — COMPREHENSIVE METABOLIC PANEL
Albumin: 3.9 g/dL (ref 3.5–5.2)
CO2: 28 mEq/L (ref 19–32)
Calcium: 9.6 mg/dL (ref 8.4–10.5)
Glucose, Bld: 91 mg/dL (ref 70–99)
Potassium: 3.8 mEq/L (ref 3.5–5.3)
Sodium: 140 mEq/L (ref 135–145)
Total Protein: 7 g/dL (ref 6.0–8.3)

## 2013-07-06 MED ORDER — THYROID 65 MG PO TABS: 65.0000 mg | ORAL_TABLET | Freq: Every day | ORAL | Status: DC

## 2013-07-06 NOTE — Progress Notes (Signed)
Patient here for labs only. 

## 2013-07-07 ENCOUNTER — Telehealth: Payer: Self-pay

## 2013-07-07 DIAGNOSIS — IMO0001 Reserved for inherently not codable concepts without codable children: Secondary | ICD-10-CM

## 2013-07-07 LAB — COMPREHENSIVE METABOLIC PANEL
ALT: 196 U/L — ABNORMAL HIGH (ref 0–35)
AST: 442 U/L — ABNORMAL HIGH (ref 0–37)
Calcium: 9.6 mg/dL (ref 8.4–10.5)
Chloride: 103 mEq/L (ref 96–112)
Creat: 0.75 mg/dL (ref 0.50–1.10)
Potassium: 4.3 mEq/L (ref 3.5–5.3)
Sodium: 140 mEq/L (ref 135–145)
Total Bilirubin: 0.5 mg/dL (ref 0.3–1.2)
Total Protein: 7.2 g/dL (ref 6.0–8.3)

## 2013-07-07 LAB — T3, FREE: T3, Free: 3.9 pg/mL (ref 2.3–4.2)

## 2013-07-07 LAB — T4, FREE: Free T4: 1.01 ng/dL (ref 0.80–1.80)

## 2013-07-07 NOTE — Telephone Encounter (Signed)
[facility] called to report that pt is scheduled to come in the morn for [place] lower extremities arterial bilateral, but the order we put in is no longer an active code. Order either needs to be changed to with or without exercise. I called Dr Clelia Croft who instr'd to put in order for the test to be done w/out exercise, which is now called "segmental multiple" with code # 16109. I am putting in new order.

## 2013-07-07 NOTE — Telephone Encounter (Signed)
Patient is wondering whether her lab results have come back yet. She would like a call back as soon as possible.

## 2013-07-08 ENCOUNTER — Telehealth: Payer: Self-pay | Admitting: Family Medicine

## 2013-07-08 ENCOUNTER — Other Ambulatory Visit (INDEPENDENT_AMBULATORY_CARE_PROVIDER_SITE_OTHER): Payer: BC Managed Care – PPO

## 2013-07-08 ENCOUNTER — Inpatient Hospital Stay: Admission: RE | Admit: 2013-07-08 | Payer: BC Managed Care – PPO | Source: Ambulatory Visit

## 2013-07-08 ENCOUNTER — Ambulatory Visit
Admission: RE | Admit: 2013-07-08 | Discharge: 2013-07-08 | Disposition: A | Discharge status: Home / Self Care | Payer: BC Managed Care – PPO | Source: Ambulatory Visit | Attending: Family Medicine | Admitting: Family Medicine

## 2013-07-08 ENCOUNTER — Telehealth: Payer: Self-pay | Admitting: Physician Assistant

## 2013-07-08 DIAGNOSIS — IMO0001 Reserved for inherently not codable concepts without codable children: Secondary | ICD-10-CM

## 2013-07-08 DIAGNOSIS — M6282 Rhabdomyolysis: Secondary | ICD-10-CM

## 2013-07-08 DIAGNOSIS — R748 Abnormal levels of other serum enzymes: Secondary | ICD-10-CM

## 2013-07-08 LAB — BASIC METABOLIC PANEL
CO2: 29 mEq/L (ref 19–32)
Creat: 0.72 mg/dL (ref 0.50–1.10)
Glucose, Bld: 95 mg/dL (ref 70–99)
Potassium: 4.7 mEq/L (ref 3.5–5.3)
Sodium: 140 mEq/L (ref 135–145)

## 2013-07-08 LAB — CK: Total CK: 4585 U/L — ABNORMAL HIGH (ref 7–177)

## 2013-07-08 NOTE — Progress Notes (Signed)
Patient here for labs only. 

## 2013-07-08 NOTE — Telephone Encounter (Signed)
Critical Value called from [facility]. CK now 13845 (up from 10043 on 07/05/2013)  Left message for patient to return TODAY for repeat CK and BMET (both STAT) due to rising CK.  Emergency contact number is not in service.  Orders placed.  Will continue attempting to reach this patient.

## 2013-07-08 NOTE — Telephone Encounter (Signed)
Spoke with patient, she will return to clinic for a redraw instead of going to [facility].

## 2013-07-08 NOTE — Telephone Encounter (Signed)
Patient returned my call. "I feel good." No cramping since the day before yesterday, but feels "sore."  "I don't feel like everything is falling apart."  Saw her Integrative Medicine Specialist, Dr. Rennie Plowman (fax [phone]) yesterday.  Nothing is new, but the newest supplement is MSM.  Also notes that she's increased exercise recently, walking on an incline (to reduce strain on her back), but hasn't done that since the soreness began last week.  She's happy to have repeat labs, but asks that we fax an order to [facility], where she can have them drawn without the co-pay that we require.  She has an appointment this morning for arterial dopplers, and will get the blood drawn after that.  I spoke with Cira Rue, who will help to coordinate the lab draw, either here, or at a [facility] drawing station.

## 2013-07-08 NOTE — Telephone Encounter (Signed)
Spoke with Huntley Dec she wanted me to call patient and let her know that CK is down from 2 days ago wi\hich is 4585 and to follow up in 2 days patient will return to clinic on Thursday

## 2013-07-10 ENCOUNTER — Ambulatory Visit (INDEPENDENT_AMBULATORY_CARE_PROVIDER_SITE_OTHER): Payer: BC Managed Care – PPO | Admitting: Family Medicine

## 2013-07-10 VITALS — BP 112/82 | HR 80 | Temp 97.9°F | Resp 18 | Ht 61.75 in | Wt 181.2 lb

## 2013-07-10 DIAGNOSIS — M6282 Rhabdomyolysis: Secondary | ICD-10-CM

## 2013-07-10 DIAGNOSIS — E860 Dehydration: Secondary | ICD-10-CM

## 2013-07-10 LAB — COMPREHENSIVE METABOLIC PANEL
ALT: 166 U/L — ABNORMAL HIGH (ref 0–35)
Albumin: 4 g/dL (ref 3.5–5.2)
Alkaline Phosphatase: 129 U/L — ABNORMAL HIGH (ref 39–117)
CO2: 26 mEq/L (ref 19–32)
Calcium: 9.4 mg/dL (ref 8.4–10.5)
Chloride: 100 mEq/L (ref 96–112)
Glucose, Bld: 90 mg/dL (ref 70–99)
Potassium: 4.8 mEq/L (ref 3.5–5.3)
Sodium: 136 mEq/L (ref 135–145)
Total Bilirubin: 0.6 mg/dL (ref 0.3–1.2)
Total Protein: 6.9 g/dL (ref 6.0–8.3)

## 2013-07-10 LAB — CK: Total CK: 744 U/L — ABNORMAL HIGH (ref 7–177)

## 2013-07-10 NOTE — Progress Notes (Signed)
Subjective:    Patient ID: Tracie Gonzalez, female    DOB: Dec 19, 1965, 47 y.o.   MRN: [id number]  HPI   Didn't have any leg pain yesterday but had HAs all day.  Has been completely sedentary for the past few days with legs up.   However, today she is having some right calf pain again. Normally standing on her feet 10 hrs/d on cement floors and walking on the bridge for exercise so she has stopped all of that this week. Still seeing integrative physician next Thurs - went off all of her supplements.  All she took yesterday and today was her armor thyroid, DHEA, and progesterone. Took 1 adderrall yesterday. Has been drinking tons of fluids but does feel dehydrated - dry mouth and skin, urine dark and decreased.  Past Medical History  Diagnosis Date  . Depression   . Thyroid disease    Current Outpatient Prescriptions on File Prior to Visit  Medication Sig Dispense Refill  . amphetamine-dextroamphetamine (ADDERALL) 30 MG tablet Take 1 tablet (30 mg total) by mouth 2 (two) times daily. Do not fill before 08/02/2012  60 tablet  0  . thyroid (ARMOUR) 65 MG tablet Take 1 tablet (65 mg total) by mouth daily.  90 tablet  0  . valACYclovir (VALTREX) 1000 MG tablet Two tablets twice daily for acute flare  40 tablet  10   No current facility-administered medications on file prior to visit.   No Known Allergies   Review of Systems  Constitutional: Positive for activity change. Negative for fever, chills and unexpected weight change.  Genitourinary: Positive for decreased urine volume. Negative for dysuria, urgency and frequency.  Musculoskeletal: Positive for myalgias and back pain. Negative for joint swelling, arthralgias and gait problem.  Skin: Negative for color change and rash.  Neurological: Positive for headaches. Negative for weakness and numbness.  Psychiatric/Behavioral: Negative for sleep disturbance.      BP 112/82  Pulse 80  Temp(Src) 97.9 F (36.6 C) (Oral)  Resp 18  Ht 5'  1.75" (1.568 m)  Wt 181 lb 3.2 oz (82.192 kg)  BMI 33.43 kg/m2  SpO2 95% Objective:   Physical Exam  Constitutional: She is oriented to person, place, and time. She appears well-developed and well-nourished. No distress.  HENT:  Head: Normocephalic and atraumatic.  Right Ear: External ear normal.  Left Ear: External ear normal.  Eyes: Conjunctivae are normal. No scleral icterus.  Neck: Normal range of motion. Neck supple. No thyromegaly present.  Cardiovascular: Normal rate, regular rhythm, normal heart sounds and intact distal pulses.   Pulmonary/Chest: Effort normal and breath sounds normal. No respiratory distress.  Musculoskeletal: She exhibits no edema.       Right lower leg: Normal. She exhibits no tenderness, no bony tenderness, no swelling and no edema.       Left lower leg: Normal. She exhibits no tenderness, no bony tenderness, no swelling and no edema.  Lymphadenopathy:    She has no cervical adenopathy.  Neurological: She is alert and oriented to person, place, and time.  Skin: Skin is warm and dry. She is not diaphoretic. No erythema.  Psychiatric: She has a normal mood and affect. Her behavior is normal.          Assessment & Plan:  Rhabdomyolysis - Plan: Comprehensive metabolic panel, CK Since pt is still having calf pain, after her labs were drawn, we placed an IV and gave her 2L of LR IVF over several hours.  She will continue  physical rest and try to cont minimizing her adderrall use.  Recheck here on Sat for repeat labs - if labs are back to nml on Sun she can go back to work on Mon and gradually resume nml daily activities.  Will need her thyroid checked in 6 wks since armor thyroid dose was decreased from 90 to 60 earlier this week.

## 2013-07-11 ENCOUNTER — Telehealth: Payer: Self-pay | Admitting: Radiology

## 2013-07-11 NOTE — Telephone Encounter (Signed)
Patient advised of labs, CK is 744 she planning to come in tomorrow for recheck. To you FYI

## 2013-07-12 ENCOUNTER — Ambulatory Visit (INDEPENDENT_AMBULATORY_CARE_PROVIDER_SITE_OTHER): Payer: BC Managed Care – PPO | Admitting: Physician Assistant

## 2013-07-12 VITALS — BP 122/80 | HR 84 | Temp 98.0°F | Resp 20 | Ht 62.0 in | Wt 184.4 lb

## 2013-07-12 DIAGNOSIS — M6282 Rhabdomyolysis: Secondary | ICD-10-CM

## 2013-07-12 LAB — COMPREHENSIVE METABOLIC PANEL
ALT: 132 U/L — ABNORMAL HIGH (ref 0–35)
AST: 66 U/L — ABNORMAL HIGH (ref 0–37)
BUN: 12 mg/dL (ref 6–23)
Chloride: 106 mEq/L (ref 96–112)
Creat: 0.7 mg/dL (ref 0.50–1.10)
Sodium: 138 mEq/L (ref 135–145)
Total Bilirubin: 0.5 mg/dL (ref 0.3–1.2)
Total Protein: 6.4 g/dL (ref 6.0–8.3)

## 2013-07-12 LAB — POCT URINALYSIS DIPSTICK
Bilirubin, UA: NEGATIVE
Blood, UA: NEGATIVE
Glucose, UA: NEGATIVE
Ketones, UA: NEGATIVE
Leukocytes, UA: NEGATIVE
Leukocytes, UA: NEGATIVE
Nitrite, UA: NEGATIVE
Protein, UA: NEGATIVE
Urobilinogen, UA: 0.2
Urobilinogen, UA: 0.2
pH, UA: 6
pH, UA: 7.5

## 2013-07-12 NOTE — Progress Notes (Signed)
   [address], [place] [address]   Phone [phone]  Subjective:    Patient ID: Tracie Gonzalez, female    DOB: 1965/11/18, 47 y.o.   MRN: [id number]  HPI Pt presents to clinic for recheck.  She has had no more cramping since she got IV fluids 48h ago.  She has been trying to orally hydrate but her urine is still really dark.  She has not been working this past week and doing minimal activity.  Her skin is tenting but has been doing that for almost a year and she is working with a alternative health MD for that along with her menopausal hormone disregulation.  Review of Systems     Objective:   Physical Exam  Vitals reviewed. Constitutional: She is oriented to person, place, and time. She appears well-developed and well-nourished.  HENT:  Head: Normocephalic and atraumatic.  Right Ear: External ear normal.  Left Ear: External ear normal.  Eyes: Conjunctivae are normal.  Cardiovascular: Normal rate, regular rhythm and normal heart sounds.   Pulmonary/Chest: Effort normal and breath sounds normal.  Neurological: She is alert and oriented to person, place, and time.  Skin: Skin is warm, dry and intact.  Psychiatric: She has a normal mood and affect. Her behavior is normal. Judgment and thought content normal.   Results for orders placed in visit on 07/12/13  POCT URINALYSIS DIPSTICK      Result Value Range   Color, UA yellow     Clarity, UA clear     Glucose, UA 100     Bilirubin, UA neg     Ketones, UA trace     Spec Grav, UA >=1.030     Blood, UA neg     pH, UA 6.0     Protein, UA neg     Urobilinogen, UA 0.2     Nitrite, UA neg     Leukocytes, UA Negative    POCT URINALYSIS DIPSTICK      Result Value Range   Color, UA yellow     Clarity, UA clear     Glucose, UA neg     Bilirubin, UA neg     Ketones, UA neg     Spec Grav, UA 1.015     Blood, UA neg     pH, UA 7.5     Protein, UA neg     Urobilinogen, UA 0.2     Nitrite, UA neg     Leukocytes, UA Negative          Assessment & Plan:  Rhabdomyolysis - Plan: POCT urinalysis dipstick, Comprehensive metabolic panel, POCT urinalysis dipstick, CK  Pt given 2 L IV normal saline and her urine concentration improves.  Her blood was drawn after her IV fluids and order STAT to allow [place] to determine f/u about recheck tomorrow or ok to RTW on Monday. Pt is to continue oral hydrate with water only.  Benny Lennert PA-C 07/12/2013 2:54 PM  Pt labs returned and I discussed with.  Her CK has improved as have her LFTS.  With her cramping resolved and improved labs pt will RTW and recheck Monday evening for repeat labs. If she develops cramping tomorrow she will RTC tomorrow for repeat IV fluids.  She will push water today and really make sure she stays hydrated.  Her questions were answered and she understands the plan.

## 2013-07-13 ENCOUNTER — Telehealth: Payer: Self-pay

## 2013-07-13 NOTE — Telephone Encounter (Signed)
Patient called and needs note for work so to come in on Monday for IV's. Patient needs to be sure her employer will let her off in time to come to office.  You may call pt to discuss what is needed. [phone]. Patient stated will come by office around 4 today.  Explain to patient this may not be ready at this time.  Patient is seen by Benny Lennert

## 2013-07-13 NOTE — Telephone Encounter (Signed)
Pt came by and I wrote her a note for tomorrow.

## 2013-07-13 NOTE — Telephone Encounter (Signed)
I am happy to give her a note but I do not expect her to need IV fluids at that visit, this visit will be for repeat labs.  I need her to recheck about 5pm with me or Dr Clelia Croft and she is ok to go to work.

## 2013-07-14 ENCOUNTER — Ambulatory Visit (INDEPENDENT_AMBULATORY_CARE_PROVIDER_SITE_OTHER): Payer: BC Managed Care – PPO | Admitting: Family Medicine

## 2013-07-14 VITALS — BP 112/76 | HR 82 | Temp 97.9°F | Resp 20 | Ht 62.5 in | Wt 186.5 lb

## 2013-07-14 DIAGNOSIS — M6282 Rhabdomyolysis: Secondary | ICD-10-CM

## 2013-07-14 DIAGNOSIS — H6980 Other specified disorders of Eustachian tube, unspecified ear: Secondary | ICD-10-CM

## 2013-07-14 DIAGNOSIS — H6982 Other specified disorders of Eustachian tube, left ear: Secondary | ICD-10-CM

## 2013-07-14 NOTE — Patient Instructions (Addendum)
Recommend lots of steam treatment (humidifiers, hot showers, put your head over boiling water several times a day) and use nasal saline spray as frequently as possible. Sometimes, we need to augment this with sudafed or nasal steroids or a visit to the ear doctor but hopefully we can avoid this. Gradually resume your normal activity. I suspect your hip pain is due to going back to work 9 hrs on a concrete floor after 2 weeks of relative bed rest. However, if your hips keep hurting, you may have bursitis - see info below. Have your thyroid rechecked in 5 more weeks (6 wks from when we changed your dose from 90 to 60).  OK to gradually resume your supplements and naturopathic regimens one at a time.  Try to remain on the adderrall once a day for as long as possible.  This should be increased back to twice a day only when you are COMPLETELY back to normal as far as everything else as it is likely to be what could push your muscles back over the edge into breaking down again.  Barotitis Media Barotitis media is soreness (inflammation) of the area behind the eardrum (middle ear). This occurs when the auditory tube (Eustachian tube) leading from the back of the throat to the eardrum is blocked. When it is blocked air cannot move in and out of the middle ear to equalize pressure changes. These pressure changes come from changes in altitude when:  Flying.  Driving in the mountains.  Diving. Problems are more likely to occur with pressure changes during times when you are congested as from:  Hay fever.  Upper respiratory infection.  A cold. Damage or hearing loss (barotrauma) caused by this may be permanent. HOME CARE INSTRUCTIONS   Use medicines as recommended by your caregiver. Over the counter medicines will help unblock the canal and can help during times of air travel.  Do not put anything into your ears to clean or unplug them. Eardrops will not be helpful.  Do not swim, dive, or fly until  your caregiver says it is all right to do so. If these activities are necessary, chewing gum with frequent swallowing may help. It is also helpful to hold your nose and gently blow to pop your ears for equalizing pressure changes. This forces air into the Eustachian tube.  For little ones with problems, give your baby a bottle of water or juice during periods when pressure changes would be anticipated such as during take offs and landings associated with air travel.  Only take over-the-counter or prescription medicines for pain, discomfort, or fever as directed by your caregiver.  A decongestant may be helpful in de-congesting the middle ear and make pressure equalization easier. This can be even more effective if the drops (spray) are delivered with the head lying over the edge of a bed with the head tilted toward the ear on the affected side.  If your caregiver has given you a follow-up appointment, it is very important to keep that appointment. Not keeping the appointment could result in a chronic or permanent injury, pain, hearing loss and disability. If there is any problem keeping the appointment, you must call back to this facility for assistance. SEEK IMMEDIATE MEDICAL CARE IF:   You develop a severe headache, dizziness, severe ear pain, or bloody or pus-like drainage from your ears.  An oral temperature above 102 F (38.9 C) develops.  Your problems do not improve or become worse. MAKE SURE YOU:  Understand these instructions.  Will watch your condition.  Will get help right away if you are not doing well or get worse. Document Released: 09/29/2000 Document Revised: 12/25/2011 Document Reviewed: 05/07/2008 [facility] Patient Information 2014 [place], [place].

## 2013-07-14 NOTE — Progress Notes (Signed)
Subjective:    Patient ID: Tracie Gonzalez, female    DOB: 25-Oct-1965, 47 y.o.   MRN: [id number] Chief Complaint  Patient presents with  . Follow-up    Recheck blood work    HPI Went back to work today but worked 9 hrs instead of 10.  After 2-3 hrs at work, started getting pain in her bilateral thighs - upper hips and a little burning around her buttocks - different from her calf pain from previous.  Did drink about 32-64 oz today. Taking adderrall once a day which has been a difficult for her adjust. Urine isn't quite as dark.   Was also taking tetracycline for her acne during the week before this started as well.  And had been drinking a lot of green tea, vinegar and an herbal powder. Seeing her alternative medication doctor on Thursday.  Has been having left ear pain since February. Tried 1 month of nasal steroid spray w/o success sev mos ago.  Past Medical History  Diagnosis Date  . Depression   . Thyroid disease    Current Outpatient Prescriptions on File Prior to Visit  Medication Sig Dispense Refill  . 5-Hydroxytryptophan (5-HTP) 100 MG CAPS Take by mouth 3 (three) times daily.      Marland Kitchen amphetamine-dextroamphetamine (ADDERALL) 30 MG tablet Take 1 tablet (30 mg total) by mouth 2 (two) times daily. Do not fill before 08/02/2012  60 tablet  0  . APPLE CIDER VINEGAR PO Take 2 oz by mouth daily. In water      . Ascorbic Acid (VITAMIN C) 1000 MG tablet Take 1,000 mg by mouth 3 (three) times daily.      . COLLAGEN-CHOND-HYALURONIC ACID PO Take by mouth 2 (two) times daily.      Marland Kitchen D-Ribose POWD by Does not apply route.      Chilton Si Tea, Camillia sinensis, (GREEN TEA PO) Take 1 each by mouth daily. With vinager and water      . Lactobacillus (ACIDOPHILUS PO) Take 125 mg by mouth 2 (two) times daily.      . Methylsulfonylmethane 1000 MG CAPS Take by mouth 2 (two) times daily.      . Multiple Vitamin (MULTIVITAMIN) tablet Take 1 tablet by mouth daily.      . Nutritional Supplements (DHEA  PO) Take by mouth.      Marland Kitchen OVER THE COUNTER MEDICATION Take 100 mg by mouth daily. R-Lipic Acid      . Progesterone 25 MG SUPP Place 25 mg vaginally at bedtime.      . silver nitrate crystals by Does not apply route as needed.      . thyroid (ARMOUR) 65 MG tablet Take 1 tablet (65 mg total) by mouth daily.  90 tablet  0  . valACYclovir (VALTREX) 1000 MG tablet Two tablets twice daily for acute flare  40 tablet  10  . Vitamin Mixture (ESTER-C PO) Take 1,000 mg by mouth 3 (three) times daily.       No current facility-administered medications on file prior to visit.   No Known Allergies   Review of Systems  Constitutional: Positive for activity change. Negative for fever, chills, diaphoresis and appetite change.  HENT: Positive for congestion, ear pain and postnasal drip. Negative for rhinorrhea and sinus pressure.   Respiratory: Negative for shortness of breath.   Cardiovascular: Negative for chest pain, palpitations and leg swelling.  Genitourinary: Positive for decreased urine volume. Negative for dysuria and hematuria.  Musculoskeletal: Positive for gait problem  and myalgias. Negative for arthralgias, back pain and joint swelling.  Neurological: Positive for weakness. Negative for numbness.  Hematological: Negative for adenopathy.  Psychiatric/Behavioral: Positive for decreased concentration.      BP 112/76  Pulse 82  Temp(Src) 97.9 F (36.6 C)  Resp 20  Ht 5' 2.5" (1.588 m)  Wt 186 lb 8 oz (84.596 kg)  BMI 33.55 kg/m2  SpO2 98% Objective:   Physical Exam  Constitutional: She is oriented to person, place, and time. She appears well-developed and well-nourished. No distress.  HENT:  Head: Normocephalic and atraumatic.  Right Ear: Tympanic membrane, external ear and ear canal normal.  Left Ear: External ear and ear canal normal. Tympanic membrane is retracted. Tympanic membrane is not injected and not erythematous. A middle ear effusion is present.  Nose: Nose normal. No  mucosal edema or rhinorrhea.  Mouth/Throat: Uvula is midline, oropharynx is clear and moist and mucous membranes are normal. No oropharyngeal exudate.  Eyes: Conjunctivae are normal. Right eye exhibits no discharge. Left eye exhibits no discharge. No scleral icterus.  Neck: Normal range of motion. Neck supple.  Cardiovascular: Normal rate, regular rhythm, normal heart sounds and intact distal pulses.   Pulmonary/Chest: Effort normal and breath sounds normal.  Lymphadenopathy:    She has no cervical adenopathy.  Neurological: She is alert and oriented to person, place, and time.  Skin: Skin is warm and dry. She is not diaphoretic. No erythema.  Psychiatric: She has a normal mood and affect. Her behavior is normal.      Assessment & Plan:  Rhabdomyolysis - Plan: CK, Hepatic function panel  Eustachian tube dysfunction, left   Norberto Sorenson, MD MPH

## 2013-07-15 ENCOUNTER — Telehealth: Payer: Self-pay

## 2013-07-15 LAB — HEPATIC FUNCTION PANEL
ALT: 100 U/L — ABNORMAL HIGH (ref 0–35)
AST: 41 U/L — ABNORMAL HIGH (ref 0–37)
Alkaline Phosphatase: 127 U/L — ABNORMAL HIGH (ref 39–117)
Bilirubin, Direct: 0.1 mg/dL (ref 0.0–0.3)
Total Bilirubin: 0.4 mg/dL (ref 0.3–1.2)

## 2013-07-15 LAB — CK: Total CK: 182 U/L — ABNORMAL HIGH (ref 7–177)

## 2013-07-15 NOTE — Telephone Encounter (Signed)
PATIENT STATES DR. SHAW TOLD HER THAT SOMEONE WOULD CALL HER TODAY REGARDING HER CK LEVEL. SHE HAS NOT GOTTEN A CALL BACK YET. BEST PHONE [phone] (CELL)    PHARMACY CHOICE IS [facility] ON [address].   MBC

## 2013-07-17 NOTE — Telephone Encounter (Signed)
Patient advised, she is at the endocrinologist now.

## 2013-07-17 NOTE — Telephone Encounter (Signed)
Left message for her to call me back, so I can advise.  

## 2013-07-17 NOTE — Telephone Encounter (Signed)
Her level is 182 - so back to normal and fine to resume normal activity. Gradually add in prior medications/supplements. Save increasing the adderrall back to bid for the very last thing and only if absolutely necessary- after activity level and all other meds are back to normal.  Continue on armour thyroid 60 and recheck tsh in 5 wks. Recommend recheck of her ck level and lfts at her doctor's appt this week or if he doesn't want to do this, RTC in 1 wk for recheck.

## 2013-07-21 NOTE — Telephone Encounter (Signed)
07/21/2013 - AMY - CAN THIS ENCOUNTER BE CLOSED?    THANKS,  MBC

## 2013-07-27 ENCOUNTER — Ambulatory Visit (INDEPENDENT_AMBULATORY_CARE_PROVIDER_SITE_OTHER): Payer: BC Managed Care – PPO | Admitting: Physician Assistant

## 2013-07-27 DIAGNOSIS — M6282 Rhabdomyolysis: Secondary | ICD-10-CM

## 2013-07-27 NOTE — Progress Notes (Signed)
Pt presents to clinic without increased symptoms - she has returned to taking her DHEA, amour thyroid, progesterone, 5 HTP and Adderall.  She feels good.

## 2013-07-28 LAB — HEPATIC FUNCTION PANEL
AST: 26 U/L (ref 0–37)
Albumin: 4 g/dL (ref 3.5–5.2)
Alkaline Phosphatase: 96 U/L (ref 39–117)
Indirect Bilirubin: 0.6 mg/dL (ref 0.0–0.9)
Total Bilirubin: 0.8 mg/dL (ref 0.3–1.2)

## 2013-07-31 NOTE — Telephone Encounter (Signed)
07/31/2013 - Tracie Gonzalez - CAN THIS ENCOUNTER BE CLOSED?   THANKS,  MBC

## 2013-07-31 NOTE — Telephone Encounter (Signed)
Yes,t his is done. Patient has been advised and has seen the Endocrinologist.

## 2013-07-31 NOTE — Telephone Encounter (Signed)
07/31/2013 - AMY - CAN THIS ENCOUNTER BE CLOSED? LAST ENCOUNTER SENT TO 104 CLINICAL MESSAGE POOL IN ERROR.  THANKS,  MBC °

## 2013-09-23 ENCOUNTER — Ambulatory Visit: Payer: BC Managed Care – PPO

## 2013-09-23 ENCOUNTER — Ambulatory Visit (INDEPENDENT_AMBULATORY_CARE_PROVIDER_SITE_OTHER): Payer: BC Managed Care – PPO | Admitting: Family Medicine

## 2013-09-23 VITALS — BP 102/64 | HR 80 | Temp 98.0°F | Resp 18 | Ht 63.0 in | Wt 176.0 lb

## 2013-09-23 DIAGNOSIS — R209 Unspecified disturbances of skin sensation: Secondary | ICD-10-CM

## 2013-09-23 DIAGNOSIS — R202 Paresthesia of skin: Secondary | ICD-10-CM

## 2013-09-23 DIAGNOSIS — M542 Cervicalgia: Secondary | ICD-10-CM

## 2013-09-23 DIAGNOSIS — M549 Dorsalgia, unspecified: Secondary | ICD-10-CM
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
## 2013-09-24 ENCOUNTER — Other Ambulatory Visit: Payer: Self-pay | Admitting: Family Medicine

## 2013-09-24 DIAGNOSIS — R202 Paresthesia of skin: Secondary | ICD-10-CM

## 2013-09-24 LAB — VITAMIN B12: Vitamin B-12: >2000 pg/mL — ABNORMAL HIGH (ref 211–911)

## 2013-09-29 ENCOUNTER — Ambulatory Visit: Payer: BC Managed Care – PPO | Admitting: Neurology

## 2013-10-29 ENCOUNTER — Encounter: Payer: Self-pay | Admitting: Family Medicine

## 2013-10-30 ENCOUNTER — Encounter: Payer: Self-pay | Admitting: Neurology

## 2013-10-30 ENCOUNTER — Encounter: Payer: Self-pay | Admitting: Family Medicine

## 2013-10-30 ENCOUNTER — Ambulatory Visit (INDEPENDENT_AMBULATORY_CARE_PROVIDER_SITE_OTHER): Payer: BC Managed Care – PPO | Admitting: Neurology

## 2013-10-30 ENCOUNTER — Other Ambulatory Visit: Payer: Self-pay | Admitting: Family Medicine

## 2013-10-30 ENCOUNTER — Telehealth: Payer: Self-pay | Admitting: Neurology

## 2013-10-30 VITALS — BP 124/76 | HR 68 | Ht 63.0 in | Wt 176.0 lb

## 2013-10-30 DIAGNOSIS — R51 Headache: Secondary | ICD-10-CM

## 2013-10-30 DIAGNOSIS — E039 Hypothyroidism, unspecified: Secondary | ICD-10-CM

## 2013-10-30 DIAGNOSIS — R209 Unspecified disturbances of skin sensation: Secondary | ICD-10-CM

## 2013-10-30 DIAGNOSIS — G4486 Cervicogenic headache: Secondary | ICD-10-CM | POA: Insufficient documentation

## 2013-10-30 MED ORDER — BACLOFEN 10 MG PO TABS: 10.0000 mg | ORAL_TABLET | Freq: Every day | ORAL | Status: DC
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
## 2013-10-30 NOTE — Telephone Encounter (Signed)
Patient calling to see if we need to also do an MR of her back due to her symptoms. I advised that Dr Allena KatzPatel did not indicate in her note that she wanted patient to have an MR of her back. I advised that we should start with an MR brain. Patient will call back with any other questions.

## 2013-10-30 NOTE — Patient Instructions (Addendum)
1.  MRI brain wwo contrast 2.  EMG of the arms-90 min 3.  Start taking baclofen 10mg  at bedtime 5.  Continue to perform physical therapy as you are 6.  Return to clinic in 6-weeks.

## 2013-10-31 NOTE — Telephone Encounter (Signed)
Dr Clelia CroftShaw, pt sent you a message about this also. I wanted to check w/you before RFing because her level was abnormal and dose was changed in Sept. Did you want pt to RTC first for repeat lab or OK to RF for 90 days?

## 2013-10-31 NOTE — Telephone Encounter (Signed)
No do not refill, pt needs labs first. If she wants to sched an appt for lab, ok to refill x 30d. mychart message sent to pt w/ this info.

## 2013-11-03 NOTE — Telephone Encounter (Signed)
I put in a future order for TSH due to hypothyroidism. Can you please print out or write rx for pt to get this drawn at [facility] so she doesn't have a copay. Thanks.

## 2013-11-04 NOTE — Telephone Encounter (Signed)
Lab, can you please put in this order for pt's tsh at [facility]/ and notify pt of plan when ready?

## 2013-11-04 NOTE — Telephone Encounter (Signed)
Please print out order for pt to get tsh filled at [facility], then will refill thyroid med after results received.

## 2013-11-06 ENCOUNTER — Telehealth: Payer: Self-pay | Admitting: Family Medicine

## 2013-11-06 NOTE — Telephone Encounter (Signed)
left message on machine to give us a call back she need her blood drawn before she get medicine filled

## 2013-11-06 NOTE — Telephone Encounter (Signed)
[facility] filled out and in the lab

## 2013-11-06 NOTE — Telephone Encounter (Signed)
Spoke with patient she will be picking up RX to take to [facility] this evening

## 2013-11-11 ENCOUNTER — Ambulatory Visit (HOSPITAL_COMMUNITY): Payer: BC Managed Care – PPO

## 2013-11-18 ENCOUNTER — Telehealth: Payer: Self-pay | Admitting: Neurology

## 2013-11-18 NOTE — Telephone Encounter (Signed)
[facility] brain [facility] rec'd:  Few small scattered nonspecific T2 hyperintense lesions in the white matter without enhancement.  I attempted to contact patient via phone today regarding these results, however there was no answer so a message was left for the patient to return my call.   Donika K. Allena KatzPatel, DO

## 2013-11-21 ENCOUNTER — Encounter: Payer: Self-pay | Admitting: Family Medicine

## 2013-11-24 ENCOUNTER — Telehealth: Payer: Self-pay

## 2013-11-24 NOTE — Telephone Encounter (Signed)
PT STATES SHE IS ON INTERMITTENTLY WITH FMLA AND HAVE HAD TO GO OVER 1 DAY BECAUSE OF AN ILLNESS, SHE ONLY SEES DR KURT, BUT NEED TO HAVE THE 27TH OF January TO PROTECT HER JOB. PLEASE CALL [phone] AND SHE WILL COME PICK UP, NEED BY THE END OF THE WEEK

## 2013-11-25 NOTE — Telephone Encounter (Signed)
PT STATES THAT Monday IS THE DEADLINE FOR HER TO FAX THIS, SHE STATES THAT SHE WENT OVER A DAY BECAUSE OF HER BACK PROBLEM. HER JOB STATED THAT ALL SHE NEEDS IS A DOCTORS NOTE FOR 11/11/13.

## 2013-11-25 NOTE — Telephone Encounter (Signed)
Is it ok for a work note? Wasn't seen in our office that day?

## 2013-11-26 ENCOUNTER — Encounter: Payer: Self-pay | Admitting: Neurology

## 2013-11-26 ENCOUNTER — Ambulatory Visit (INDEPENDENT_AMBULATORY_CARE_PROVIDER_SITE_OTHER): Payer: BC Managed Care – PPO | Admitting: Neurology

## 2013-11-26 DIAGNOSIS — M5412 Radiculopathy, cervical region: Secondary | ICD-10-CM

## 2013-11-26 DIAGNOSIS — G5600 Carpal tunnel syndrome, unspecified upper limb: Secondary | ICD-10-CM

## 2013-11-26 LAB — TSH: TSH: 0.06 u[IU]/mL — AB (ref 0.41–5.90)

## 2013-11-26 NOTE — Telephone Encounter (Signed)
Spoke to patient, she is aware and will pick note up today.

## 2013-11-26 NOTE — Telephone Encounter (Signed)
I have printed a letter for pt.  It is ready for pick up.  It states that she reports she was absent from work d/t back pain for which she has FMLA.  Since she was not evaluated in our clinic 11/11/13, I cannot confirm the reason for her absence though, and I have stated this in the letter.

## 2013-11-27 ENCOUNTER — Encounter: Payer: BC Managed Care – PPO | Admitting: Neurology

## 2013-11-27 ENCOUNTER — Encounter: Payer: Self-pay | Admitting: Neurology

## 2013-11-27 NOTE — Procedures (Signed)
[facility]  [address], [address]  [place], [place] [address] Tel: [phone] Fax:  [phone] Test Date:  11/26/2013  Patient: Tracie BimlerDorcas Gonzalez DOB: [birth year] Physician: Nita Sickleonika Jeshawn Melucci, DO  Sex: Female Height: 5\' 3"  Ref Phys: Nita Sickleatel, Tracie Gonzalez  ID#: [id number] Temp: 34.6 Technician:    Patient Complaints: This is a 48 year-old female presenting with bilateral hand numbness and tingling, worse on the left side.  NCV & EMG Findings: Extensive evaluation of the left upper extremity and additional studies of the right reveals: 1. Bilateral median sensory responses are prolonged (borderline on the left, L 3.454ms R3.818ms) and mildly reduced in amplitude (L 18.5 V, R 19.4 V). Ulnar and radial sensory responses are normal.  Abnormal left palmer studies. 2. The left median motor response is also prolonged (4.590ms) and shows reduced amplitude (5.7 mV).  The right median and bilateral ulnar responses are normal.  3. Chronic motor axon loss changes are seen in the left abductor pollicis brevis, right biceps and right pronator teres muscles without accompanied active denervation.  Impression: 1. There is electrophysiologic evidence of an old intraspinal canal lesion (i.e. radiculopathy) affecting the right C6 nerve root/segment, very mild in degree electrically. 2. Bilateral median neuropathy at or distal to the wrist consistent with the clinical diagnosis of carpal tunnel syndrome, moderate in degree electrically on the left and mild on the right side.  A superimposed left T1 motor radiculopathy cannot be excluded given the greater loss of motor fiber involvement compared to sensory axon loss. ___________________________ Nita Sickleonika Alicja Everitt, DO    Nerve Conduction Studies Anti Sensory Summary Table   Site NR Peak (ms) Norm Peak (ms) P-T Amp (V) Norm P-T Amp  Left Median Anti Sensory (2nd Digit)  Wrist    3.4 <3.4 18.5 >20  Right Median Anti Sensory (2nd Digit)  Wrist    3.8 <3.4 19.4 >20   Left Radial Anti Sensory (Base 1st Digit)  Wrist    2.1 <2.7 23.2 >18  Right Radial Anti Sensory (Base 1st Digit)  Wrist    2.3 <2.7 23.0 >18  Left Ulnar Anti Sensory (5th Digit)  Wrist    2.5 <3.1 20.4 >12  Right Ulnar Anti Sensory (5th Digit)  Wrist    2.7 <3.1 26.4 >12   Motor Summary Table   Site NR Onset (ms) Norm Onset (ms) O-P Amp (mV) Norm O-P Amp Site1 Site2 Delta-0 (ms) Dist (cm) Vel (m/s) Norm Vel (m/s)  Left Median Motor (Abd Poll Brev)  Wrist    4.0 <3.9 5.7 >6 Elbow Wrist 4.0 27.0 68 >50  Elbow    8.0  5.5  Axilla Elbow 4.2 0.0    Axilla    3.8  5.8         Right Median Motor (Abd Poll Brev)  Wrist    3.8 <3.9 11.2 >6 Elbow Wrist 4.3 26.0 60 >50  Elbow    8.1  11.1         Left Ulnar Motor (Abd Dig Minimi)  Wrist    2.2 <3.1 10.0 >7 B Elbow Wrist 3.2 22.0 69 >50  B Elbow    5.4  9.1  A Elbow B Elbow 1.6 10.0 63 >50  A Elbow    7.0  9.1         Right Ulnar Motor (Abd Dig Minimi)  Wrist    2.4 <3.1 12.1 >7 B Elbow Wrist 3.4 23.0 68 >50  B Elbow    5.8  11.7  A Elbow B Elbow 1.6 10.0 62 >50  A Elbow    7.4  11.2          Comparison Summary Table   Site NR Peak (ms) Norm Peak (ms) P-T Amp (V) Site1 Site2 Delta-P (ms) Norm Delta (ms)  Left Median/Ulnar Palm Comparison (Wrist - 8cm)  Median Palm    2.1 <2.2 44.1 Median Palm Ulnar Palm 0.5   Ulnar Palm    1.6 <2.2 30.0       EMG   Side Muscle Ins Act Fibs Psw Fasc Number Recrt Dur Dur. Amp Amp. Poly Poly. Comment  Left 1stDorInt Nml Nml Nml Nml Nml Nml Nml Nml Nml Nml Nml Nml N/A  Left Abd Poll Brev Nml Nml Nml Nml 2- Rapid Some 1+ Some 1+ Few 1+ N/A  Left FlexPolLong Nml Nml Nml Nml Nml Nml Nml Nml Nml Nml Nml Nml N/A  Left Ext Indicis Nml Nml Nml Nml Nml Nml Nml Nml Nml Nml Nml Nml N/A  Left PronatorTeres Nml Nml Nml Nml Nml Nml Nml Nml Nml Nml Nml Nml N/A  Left Biceps Nml Nml Nml Nml Nml Nml Nml Nml Nml Nml Nml Nml N/A  Left Triceps Nml Nml Nml Nml Nml Nml Nml Nml Nml Nml Nml Nml N/A  Left Deltoid Nml Nml Nml  Nml Nml Nml Nml Nml Nml Nml Nml Nml N/A  Right 1stDorInt Nml Nml Nml Nml Nml Nml Nml Nml Nml Nml Nml Nml N/A  Right Abd Poll Brev Nml Nml Nml Nml Nml Nml Nml Nml Nml Nml Nml Nml N/A  Right Biceps Nml Nml Nml Nml 1- Mod-R Few 1- Nml Nml Nml Nml N/A  Right PronatorTeres Nml Nml Nml Nml 1- Mod-R Few 1- Few 1- Nml Nml N/A  Right Triceps Nml Nml Nml Nml Nml Nml Nml Nml Nml Nml Nml Nml N/A  Right Deltoid Nml Nml Nml Nml Nml Nml Nml Nml Nml Nml Nml Nml N/A      Waveforms:

## 2013-11-27 NOTE — Progress Notes (Signed)
See procedure note for EMG results.  Gayatri Teasdale K. Artur Winningham, DO  

## 2013-12-03 ENCOUNTER — Encounter: Payer: Self-pay | Admitting: Neurology

## 2013-12-07 ENCOUNTER — Encounter: Payer: Self-pay | Admitting: Family Medicine

## 2013-12-09 ENCOUNTER — Encounter: Payer: Self-pay | Admitting: Neurology

## 2013-12-09 ENCOUNTER — Ambulatory Visit (INDEPENDENT_AMBULATORY_CARE_PROVIDER_SITE_OTHER): Payer: BC Managed Care – PPO | Admitting: Neurology

## 2013-12-09 VITALS — BP 122/70 | HR 80 | Ht 63.0 in | Wt 171.6 lb

## 2013-12-09 DIAGNOSIS — G4486 Cervicogenic headache: Secondary | ICD-10-CM

## 2013-12-09 DIAGNOSIS — G5600 Carpal tunnel syndrome, unspecified upper limb: Secondary | ICD-10-CM

## 2013-12-09 DIAGNOSIS — R51 Headache: Secondary | ICD-10-CM

## 2013-12-09 MED ORDER — TIZANIDINE HCL 2 MG PO TABS: 2.0000 mg | ORAL_TABLET | Freq: Every day | ORAL | Status: DC
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
## 2013-12-09 NOTE — Patient Instructions (Addendum)
1.  Start Zanaflex 2mg  at bedtime. 2.  EMG of the legs scheduled for 01/01/2014 at 1pm 3.  Return to clinic on 01/08/2014 at 8am.

## 2013-12-10 NOTE — Progress Notes (Signed)
faxed

## 2013-12-12 ENCOUNTER — Ambulatory Visit: Payer: BC Managed Care – PPO | Admitting: Neurology

## 2013-12-13 ENCOUNTER — Encounter: Payer: Self-pay | Admitting: Physician Assistant

## 2013-12-13 ENCOUNTER — Other Ambulatory Visit: Payer: Self-pay | Admitting: Physician Assistant

## 2013-12-13 MED ORDER — THYROID 48.75 MG PO TABS: 65.0000 mg | ORAL_TABLET | Freq: Every day | ORAL | Status: DC

## 2013-12-30 ENCOUNTER — Encounter: Payer: Self-pay | Admitting: Family Medicine

## 2014-01-01 ENCOUNTER — Encounter: Payer: BC Managed Care – PPO | Admitting: Neurology

## 2014-01-08 ENCOUNTER — Ambulatory Visit: Payer: BC Managed Care – PPO | Admitting: Neurology

## 2014-01-08 ENCOUNTER — Encounter: Payer: Self-pay | Admitting: Neurology

## 2014-03-19 ENCOUNTER — Ambulatory Visit: Payer: BC Managed Care – PPO

## 2014-03-19 ENCOUNTER — Encounter: Payer: Self-pay | Admitting: Family Medicine

## 2014-03-19 ENCOUNTER — Ambulatory Visit (INDEPENDENT_AMBULATORY_CARE_PROVIDER_SITE_OTHER): Payer: BC Managed Care – PPO | Admitting: Family Medicine

## 2014-03-19 VITALS — BP 118/80 | HR 72 | Temp 98.3°F | Resp 16 | Ht 61.5 in | Wt 163.4 lb

## 2014-03-19 DIAGNOSIS — M542 Cervicalgia: Secondary | ICD-10-CM

## 2014-03-19 DIAGNOSIS — H6590 Unspecified nonsuppurative otitis media, unspecified ear: Secondary | ICD-10-CM

## 2014-03-19 DIAGNOSIS — M545 Low back pain, unspecified: Secondary | ICD-10-CM

## 2014-03-19 DIAGNOSIS — F329 Major depressive disorder, single episode, unspecified: Secondary | ICD-10-CM

## 2014-03-19 DIAGNOSIS — F3289 Other specified depressive episodes: Secondary | ICD-10-CM

## 2014-03-19 DIAGNOSIS — F32A Depression, unspecified: Secondary | ICD-10-CM

## 2014-03-19 DIAGNOSIS — E860 Dehydration: Secondary | ICD-10-CM

## 2014-03-19 LAB — COMPREHENSIVE METABOLIC PANEL
ALT: 22 U/L (ref 0–35)
AST: 23 U/L (ref 0–37)
Albumin: 4.3 g/dL (ref 3.5–5.2)
Alkaline Phosphatase: 99 U/L (ref 39–117)
BUN: 13 mg/dL (ref 6–23)
CO2: 28 mEq/L (ref 19–32)
Calcium: 9.6 mg/dL (ref 8.4–10.5)
Chloride: 104 mEq/L (ref 96–112)
Creat: 0.81 mg/dL (ref 0.50–1.10)
Glucose, Bld: 94 mg/dL (ref 70–99)
Potassium: 4.3 mEq/L (ref 3.5–5.3)
Sodium: 139 mEq/L (ref 135–145)
Total Bilirubin: 0.8 mg/dL (ref 0.2–1.2)
Total Protein: 7 g/dL (ref 6.0–8.3)

## 2014-03-19 LAB — CK: Total CK: 190 U/L — ABNORMAL HIGH (ref 7–177)

## 2014-03-19 LAB — CBC WITH DIFFERENTIAL/PLATELET
Basophils Absolute: 0 10*3/uL (ref 0.0–0.1)
Basophils Relative: 0 % (ref 0–1)
Eosinophils Absolute: 0.2 10*3/uL (ref 0.0–0.7)
Eosinophils Relative: 3 % (ref 0–5)
HCT: 37.6 % (ref 36.0–46.0)
Hemoglobin: 13.1 g/dL (ref 12.0–15.0)
Lymphocytes Relative: 29 % (ref 12–46)
Lymphs Abs: 2.2 10*3/uL (ref 0.7–4.0)
MCH: 29.4 pg (ref 26.0–34.0)
MCHC: 34.8 g/dL (ref 30.0–36.0)
MCV: 84.5 fL (ref 78.0–100.0)
Monocytes Absolute: 0.5 10*3/uL (ref 0.1–1.0)
Monocytes Relative: 7 % (ref 3–12)
Neutro Abs: 4.6 10*3/uL (ref 1.7–7.7)
Neutrophils Relative %: 61 % (ref 43–77)
Platelets: 287 10*3/uL (ref 150–400)
RBC: 4.45 MIL/uL (ref 3.87–5.11)
RDW: 13.8 % (ref 11.5–15.5)
WBC: 7.6 10*3/uL (ref 4.0–10.5)

## 2014-03-19 LAB — TSH: TSH: 0.733 u[IU]/mL (ref 0.350–4.500)

## 2014-03-19 LAB — POCT URINALYSIS DIPSTICK
Bilirubin, UA: NEGATIVE
Blood, UA: NEGATIVE
Glucose, UA: NEGATIVE
Ketones, UA: NEGATIVE
Leukocytes, UA: NEGATIVE
Nitrite, UA: NEGATIVE
Protein, UA: NEGATIVE
Spec Grav, UA: 1.02
Urobilinogen, UA: 0.2
pH, UA: 6

## 2014-03-19 LAB — SEDIMENTATION RATE: Sed Rate: 25 mm/hr — ABNORMAL HIGH (ref 0–22)

## 2014-03-19 MED ORDER — BUPROPION HCL ER (SR) 100 MG PO TB12: 100.0000 mg | ORAL_TABLET | Freq: Two times a day (BID) | ORAL | Status: DC

## 2014-03-19 NOTE — Patient Instructions (Signed)
Please return in mid-July
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
## 2014-05-02 ENCOUNTER — Other Ambulatory Visit: Payer: Self-pay | Admitting: Physician Assistant

## 2014-05-04 NOTE — Telephone Encounter (Signed)
Dr. Elbert EwingsL, you just saw this pt, and had labs, but I don't know that you Rx this for pt? RFs?

## 2014-05-29 ENCOUNTER — Other Ambulatory Visit: Payer: Self-pay | Admitting: Family Medicine

## 2014-06-15 ENCOUNTER — Telehealth: Payer: Self-pay

## 2014-06-15 NOTE — Telephone Encounter (Signed)
Dr. Elbert Ewings - she had to take one extra day for her FMLA and needs a Dr. Note for today due to her back problem.   Please call when ready at [phone]

## 2014-06-16 NOTE — Telephone Encounter (Signed)
LM Advised pt need to RTC- unable to give note. Pt has not been seen since June 2015.

## 2014-06-26 ENCOUNTER — Ambulatory Visit (INDEPENDENT_AMBULATORY_CARE_PROVIDER_SITE_OTHER): Payer: BC Managed Care – PPO | Admitting: Physician Assistant

## 2014-06-26 VITALS — BP 102/64 | HR 81 | Temp 97.9°F | Resp 16 | Ht 62.0 in | Wt 170.0 lb

## 2014-06-26 DIAGNOSIS — F329 Major depressive disorder, single episode, unspecified: Secondary | ICD-10-CM

## 2014-06-26 DIAGNOSIS — F32A Depression, unspecified: Secondary | ICD-10-CM

## 2014-06-26 DIAGNOSIS — IMO0001 Reserved for inherently not codable concepts without codable children: Secondary | ICD-10-CM

## 2014-06-26 DIAGNOSIS — F3289 Other specified depressive episodes: Secondary | ICD-10-CM

## 2014-06-26 DIAGNOSIS — K1370 Unspecified lesions of oral mucosa: Secondary | ICD-10-CM

## 2014-06-26 DIAGNOSIS — K1379 Other lesions of oral mucosa: Secondary | ICD-10-CM

## 2014-06-26 DIAGNOSIS — M791 Myalgia, unspecified site: Secondary | ICD-10-CM

## 2014-06-26 MED ORDER — BUPROPION HCL ER (SR) 150 MG PO TB12: 150.0000 mg | ORAL_TABLET | Freq: Two times a day (BID) | ORAL | Status: DC

## 2014-06-27 ENCOUNTER — Telehealth: Payer: Self-pay

## 2014-06-27 LAB — COMPLETE METABOLIC PANEL WITH GFR
ALBUMIN: 4.3 g/dL (ref 3.5–5.2)
ALT: 35 U/L (ref 0–35)
AST: 23 U/L (ref 0–37)
Alkaline Phosphatase: 99 U/L (ref 39–117)
BUN: 18 mg/dL (ref 6–23)
CALCIUM: 10 mg/dL (ref 8.4–10.5)
CO2: 28 mEq/L (ref 19–32)
Chloride: 101 mEq/L (ref 96–112)
Creat: 0.9 mg/dL (ref 0.50–1.10)
GFR, EST AFRICAN AMERICAN: 87 mL/min
GFR, EST NON AFRICAN AMERICAN: 75 mL/min
GLUCOSE: 96 mg/dL (ref 70–99)
Potassium: 4.7 mEq/L (ref 3.5–5.3)
Sodium: 139 mEq/L (ref 135–145)
TOTAL PROTEIN: 7.3 g/dL (ref 6.0–8.3)
Total Bilirubin: 0.6 mg/dL (ref 0.2–1.2)

## 2014-06-27 LAB — VITAMIN B12: Vitamin B-12: >2000 pg/mL — ABNORMAL HIGH (ref 211–911)

## 2014-06-27 LAB — CK: CK TOTAL: 215 U/L — AB (ref 7–177)
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
## 2014-06-27 NOTE — Telephone Encounter (Signed)
Patient states pharmacy did not receive muscle relaxant that was supposed to be sent last night. Please return call and advise. CB# [phone]

## 2014-06-27 NOTE — Telephone Encounter (Signed)
Pt called in second time for rx, let pt know that a message has been put in, and she would receive a phone call once it has been processed. Told to allow for 24-48 business hours

## 2014-06-29 MED ORDER — TIZANIDINE HCL 2 MG PO TABS: 2.0000 mg | ORAL_TABLET | Freq: Every day | ORAL | Status: DC

## 2014-06-29 NOTE — Telephone Encounter (Signed)
Rx sent.  Meds ordered this encounter  Medications  . tiZANidine (ZANAFLEX) 2 MG tablet    Sig: Take 1 tablet (2 mg total) by mouth at bedtime.    Dispense:  30 tablet    Refill:  0    Order Specific Question:  Supervising Provider    Answer:  DOOLITTLE, ROBERT P [[id number]]

## 2014-06-29 NOTE — Telephone Encounter (Signed)
Maralyn Sago saw pt on 06/26/14 and inst'd pt to cont her muscle relaxant but we have not Rxd the zanaflex for her before. Can we refill?

## 2014-08-18 ENCOUNTER — Other Ambulatory Visit: Payer: Self-pay

## 2014-08-18 DIAGNOSIS — F32A Depression, unspecified: Secondary | ICD-10-CM

## 2014-08-18 DIAGNOSIS — F329 Major depressive disorder, single episode, unspecified: Secondary | ICD-10-CM

## 2014-08-18 MED ORDER — BUPROPION HCL ER (SR) 150 MG PO TB12: 150.0000 mg | ORAL_TABLET | Freq: Two times a day (BID) | ORAL | Status: DC

## 2014-08-21 ENCOUNTER — Ambulatory Visit (INDEPENDENT_AMBULATORY_CARE_PROVIDER_SITE_OTHER): Payer: BC Managed Care – PPO | Admitting: Physician Assistant

## 2014-08-21 VITALS — BP 126/74 | HR 84 | Temp 98.3°F | Resp 16 | Ht 62.0 in | Wt 172.0 lb

## 2014-08-21 DIAGNOSIS — M62830 Muscle spasm of back: Secondary | ICD-10-CM

## 2014-08-21 DIAGNOSIS — E063 Autoimmune thyroiditis: Secondary | ICD-10-CM

## 2014-08-21 DIAGNOSIS — E039 Hypothyroidism, unspecified: Secondary | ICD-10-CM

## 2014-08-21 MED ORDER — METAXALONE 800 MG PO TABS: 400.0000 mg | ORAL_TABLET | Freq: Three times a day (TID) | ORAL | Status: DC

## 2014-08-21 NOTE — Progress Notes (Signed)
   Subjective:    Patient ID: Tracie Gonzalez, female    DOB: 05-08-1966, 48 y.o.   MRN: [id number]  HPI Pt presents to clinic for thyroid check.  She has hashimoto's disease and wants to make sure she is on the correct dose of medication.  She is feeling fine in regards to her thyroid.  She would like to now why the muscle relaxers she has do not make her completely relaxed.  She takes them and gets sleepy but not "limp feeling" like she would expect from a muscle relaxer.  She tends to have tight muscles at her trapezius area and then every oncein a while she gets a spasm across her back but those go away pretty quick.  She has Baclofen, Flexeril and Zanaflex.  She does not take any other them regularly.  Review of Systems     Objective:   Physical Exam  Constitutional: She is oriented to person, place, and time. She appears well-developed and well-nourished.  BP 126/74 mmHg  Pulse 84  Temp(Src) 98.3 F (36.8 C)  Resp 16  Ht 5\' 2"  (1.575 m)  Wt 172 lb (78.019 kg)  BMI 31.45 kg/m2  SpO2 99%   HENT:  Head: Normocephalic and atraumatic.  Right Ear: External ear normal.  Left Ear: External ear normal.  Pulmonary/Chest: Effort normal.  Musculoskeletal:       Cervical back: She exhibits tenderness (trapezius muscle spasms bilaterally), bony tenderness (mid lower cervical area) and spasm.  Neurological: She is alert and oriented to person, place, and time. She has normal reflexes.  Skin: Skin is warm and dry.  Psychiatric: She has a normal mood and affect. Her behavior is normal. Judgment and thought content normal.  Vitals reviewed.      Assessment & Plan:  Hypothyroidism, unspecified hypothyroidism type - Plan: TSH  Hashimoto's disease - check labs - and will adjust medications as needed  Muscle spasm of back - Plan: metaxalone (SKELAXIN) 800 MG tablet - d/w pt that 1 dose of muscle relaxers may not give her a "relaxed" feeling like she is wanting.  I suggested she try several  doses when she 'tighter' than normal but for these quickly resolved muscle spasms she may not get relief from medications. She understands.  Benny LennertSarah Maleki Hippe PA-C  [facility] 08/21/2014 4:27 PM

## 2014-08-22 ENCOUNTER — Other Ambulatory Visit: Payer: Self-pay | Admitting: Physician Assistant

## 2014-08-22 LAB — TSH: TSH: 2.562 u[IU]/mL (ref 0.350–4.500)

## 2014-08-22 MED ORDER — THYROID 48.75 MG PO TABS: ORAL_TABLET | ORAL | Status: DC

## 2014-09-02 ENCOUNTER — Telehealth: Payer: Self-pay

## 2014-09-02 NOTE — Telephone Encounter (Signed)
Pt states she needs Benny LennertSarah Weber to increase her anti-depressants   Best phone for pt is [phone]   Pharmacy [facility] [address]

## 2014-09-02 NOTE — Telephone Encounter (Signed)
Pt called regarding her labs. Let her know her TSH was normal and that 6 mos of her thyroid med was sent to the pharm.

## 2014-09-04 NOTE — Telephone Encounter (Signed)
I want to make sure I remember that she likes the wellbutrin that she has to take bid and not once a day?  If this is correct I am going to change her to Wellbutrin sr 200mg  1 poi bid.  If she wants a daily medications I am going to send her in Wellbutrin xl 300mg  qd.

## 2014-09-04 NOTE — Telephone Encounter (Signed)
LM for rtn call. 

## 2014-09-05 NOTE — Telephone Encounter (Signed)
Patient returning phone call. Please call patient after 2 pm today. [phone]

## 2014-09-05 NOTE — Telephone Encounter (Signed)
Left message on machine to call back  

## 2014-09-06 NOTE — Telephone Encounter (Signed)
Left message on machine to call back  

## 2014-09-07 MED ORDER — BUPROPION HCL ER (SR) 200 MG PO TB12: 200.0000 mg | ORAL_TABLET | Freq: Two times a day (BID) | ORAL | Status: DC

## 2014-09-07 NOTE — Telephone Encounter (Signed)
Spoke with patient and yes that is correct. Will send in Wellbutrin SR 200 mg 1 po bid

## 2014-10-10 ENCOUNTER — Ambulatory Visit (INDEPENDENT_AMBULATORY_CARE_PROVIDER_SITE_OTHER): Payer: BC Managed Care – PPO | Admitting: Family Medicine

## 2014-10-10 VITALS — BP 132/76 | HR 74 | Temp 97.9°F | Resp 16 | Ht 62.25 in | Wt 179.8 lb

## 2014-10-10 DIAGNOSIS — R059 Cough, unspecified: Secondary | ICD-10-CM

## 2014-10-10 DIAGNOSIS — R05 Cough: Secondary | ICD-10-CM

## 2014-10-10 DIAGNOSIS — J0100 Acute maxillary sinusitis, unspecified: Secondary | ICD-10-CM

## 2014-10-10 MED ORDER — AMOXICILLIN 500 MG PO CAPS: 1000.0000 mg | ORAL_CAPSULE | Freq: Two times a day (BID) | ORAL | Status: DC

## 2014-10-10 MED ORDER — HYDROCOD POLST-CHLORPHEN POLST 10-8 MG/5ML PO LQCR: 5.0000 mL | Freq: Two times a day (BID) | ORAL | Status: DC | PRN

## 2014-10-10 NOTE — Progress Notes (Signed)
This chart was scribed for Ethelda ChickKristi M Marlisa Caridi, MD by Luisa DagoPriscilla Tutu, ED Scribe. This patient was seen in room 9 and the patient's care was started at 4:53 PM.  Subjective:    Patient ID: Tracie Gonzalez, female    DOB: 1966/02/08, 48 y.o.   MRN: [id number]  10/10/2014  Cough; Fever; Chills; Headache; and chest congestion   HPI Tracie Gonzalez is a 48 y.o. female with a PMhx of depression and thyroid disease.  Pt is here in the office with several complaints. She states that she has been sick for the past 5 days. Pt is complaining of associated chest congestion, nasal congestion, productive cough with yellow/brownish sputum, one episode of diaphoresis, and post nasal drip. She reports taking Alka-Seltzer with no noted relief. She also endorses intermittent mild generalized headaches and SOB. Tracie Gonzalez denies any ear pain, however, she has noted a "roaring" sound to her left ear. Pt is currently hoarse and complaining of associated fatigue. Denies any diarrhea, fever, chills, sore throat, abdominal pain, nausea, emesis, dizziness, light-headedness, or diarrhea.  She is requesting Amoxicillin which always works well for her.   Review of Systems  Constitutional: Positive for diaphoresis and fatigue. Negative for fever and chills.  HENT: Positive for congestion, postnasal drip, rhinorrhea, sinus pressure and voice change. Negative for ear pain, sore throat and trouble swallowing.   Respiratory: Positive for cough and shortness of breath.   Cardiovascular: Negative for chest pain.  Gastrointestinal: Negative for nausea, vomiting, abdominal pain and diarrhea.  Skin: Negative for rash.  Neurological: Positive for headaches. Negative for dizziness.    Past Medical History  Diagnosis Date  . Depression   . Thyroid disease    Past Surgical History  Procedure Laterality Date  . Abdominal hysterectomy     No Known Allergies Current Outpatient Prescriptions  Medication Sig Dispense Refill  .  amphetamine-dextroamphetamine (ADDERALL) 30 MG tablet Take 1 tablet (30 mg total) by mouth 2 (two) times daily. Do not fill before 08/02/2012 60 tablet 0  . APPLE CIDER VINEGAR PO Take 2 oz by mouth daily. In water    . Ascorbic Acid (VITAMIN C) 1000 MG tablet Take 1,000 mg by mouth 3 (three) times daily.    . baclofen (LIORESAL) 10 MG tablet Take 10 mg by mouth 3 (three) times daily.    Marland Kitchen. buPROPion (WELLBUTRIN SR) 200 MG 12 hr tablet Take 1 tablet (200 mg total) by mouth 2 (two) times daily. 60 tablet 3  . COLLAGEN-CHOND-HYALURONIC ACID PO Take by mouth 2 (two) times daily.    . cyclobenzaprine (FLEXERIL) 10 MG tablet Take 10 mg by mouth 3 (three) times daily as needed for muscle spasms.    . metaxalone (SKELAXIN) 800 MG tablet Take 0.5-1 tablets (400-800 mg total) by mouth 3 (three) times daily. 30 tablet 0  . Multiple Vitamin (MULTIVITAMIN) tablet Take 1 tablet by mouth daily.    Marland Kitchen. OVER THE COUNTER MEDICATION Take 100 mg by mouth daily. R-Lipic Acid    . Thyroid (NATURE-THROID) 48.75 MG TABS TAKE 1 TABLET BY MOUTH DAILY. 90 tablet 1  . valACYclovir (VALTREX) 1000 MG tablet TAKE 2 TABLETS TWICE A DAY FOR ACUTE FLARE 40 tablet 9  . Vitamin Mixture (ESTER-C PO) Take 1,000 mg by mouth 3 (three) times daily.    Marland Kitchen. amoxicillin (AMOXIL) 500 MG capsule Take 2 capsules (1,000 mg total) by mouth 2 (two) times daily. 40 capsule 0  . chlorpheniramine-HYDROcodone (TUSSIONEX) 10-8 MG/5ML LQCR Take 5 mLs by  mouth every 12 (twelve) hours as needed for cough. 180 mL 0   No current facility-administered medications for this visit.       Objective:    Triage vitals:BP 132/76 mmHg  Pulse 74  Temp(Src) 97.9 F (36.6 C) (Oral)  Resp 16  Ht 5' 2.25" (1.581 m)  Wt 179 lb 12.8 oz (81.557 kg)  BMI 32.63 kg/m2  SpO2 99%  Physical Exam  Constitutional: She is oriented to person, place, and time. She appears well-developed and well-nourished. No distress.  HENT:  Head: Normocephalic and atraumatic.  Right  Ear: Tympanic membrane and external ear normal.  Left Ear: Tympanic membrane and external ear normal.  Nose: Mucosal edema and rhinorrhea present. Right sinus exhibits maxillary sinus tenderness and frontal sinus tenderness. Left sinus exhibits maxillary sinus tenderness and frontal sinus tenderness.  Mouth/Throat: Posterior oropharyngeal erythema present.  Eyes: Conjunctivae and EOM are normal.  Neck: Neck supple. No thyromegaly present.  Cardiovascular: Normal rate, regular rhythm, normal heart sounds and intact distal pulses.  Exam reveals no gallop and no friction rub.   No murmur heard. Pulmonary/Chest: Effort normal and breath sounds normal. No respiratory distress. She has no wheezes. She has no rales. She exhibits no tenderness.  Musculoskeletal: Normal range of motion.  Lymphadenopathy:       Head (right side): Tonsillar adenopathy present.       Head (left side): Tonsillar adenopathy present.    She has no cervical adenopathy.  Neurological: She is alert and oriented to person, place, and time.  Skin: Skin is warm and dry. No rash noted. She is not diaphoretic.  Psychiatric: She has a normal mood and affect. Her behavior is normal.  Nursing note and vitals reviewed.       Assessment & Plan:   1. Acute maxillary sinusitis, recurrence not specified   2. Cough     1. Acute maxillary sinusitis:  New. Rx for Amoxicillin provided.  Recommend Flonase daily and nasal saline spray.   2.  Cough: New. Rx for Tussionex provided.  RTC for acute worsening.    Meds ordered this encounter  Medications  . amoxicillin (AMOXIL) 500 MG capsule    Sig: Take 2 capsules (1,000 mg total) by mouth 2 (two) times daily.    Dispense:  40 capsule    Refill:  0  . chlorpheniramine-HYDROcodone (TUSSIONEX) 10-8 MG/5ML LQCR    Sig: Take 5 mLs by mouth every 12 (twelve) hours as needed for cough.    Dispense:  180 mL    Refill:  0    No Follow-up on file.    I personally performed the  services described in this documentation, which was scribed in my presence. The recorded information has been reviewed and considered.   Reeve Turnley Paulita FujitaMartin Biana Haggar, M.D. [facility]  [place] [address] [place], [place]  [id number] [phone] phone [phone] fax

## 2014-10-10 NOTE — Patient Instructions (Signed)
1.  Recommend nasal saline spray to nose four times per day.   Sinusitis Sinusitis is redness, soreness, and inflammation of the paranasal sinuses. Paranasal sinuses are air pockets within the bones of your face (beneath the eyes, the middle of the forehead, or above the eyes). In healthy paranasal sinuses, mucus is able to drain out, and air is able to circulate through them by way of your nose. However, when your paranasal sinuses are inflamed, mucus and air can become trapped. This can allow bacteria and other germs to grow and cause infection. Sinusitis can develop quickly and last only a short time (acute) or continue over a long period (chronic). Sinusitis that lasts for more than 12 weeks is considered chronic.  CAUSES  Causes of sinusitis include:  Allergies.  Structural abnormalities, such as displacement of the cartilage that separates your nostrils (deviated septum), which can decrease the air flow through your nose and sinuses and affect sinus drainage.  Functional abnormalities, such as when the small hairs (cilia) that line your sinuses and help remove mucus do not work properly or are not present. SIGNS AND SYMPTOMS  Symptoms of acute and chronic sinusitis are the same. The primary symptoms are pain and pressure around the affected sinuses. Other symptoms include:  Upper toothache.  Earache.  Headache.  Bad breath.  Decreased sense of smell and taste.  A cough, which worsens when you are lying flat.  Fatigue.  Fever.  Thick drainage from your nose, which often is green and may contain pus (purulent).  Swelling and warmth over the affected sinuses. DIAGNOSIS  Your health care provider will perform a physical exam. During the exam, your health care provider may:  Look in your nose for signs of abnormal growths in your nostrils (nasal polyps).  Tap over the affected sinus to check for signs of infection.  View the inside of your sinuses (endoscopy) using an  imaging device that has a light attached (endoscope). If your health care provider suspects that you have chronic sinusitis, one or more of the following tests may be recommended:  Allergy tests.  Nasal culture. A sample of mucus is taken from your nose, sent to a lab, and screened for bacteria.  Nasal cytology. A sample of mucus is taken from your nose and examined by your health care provider to determine if your sinusitis is related to an allergy. TREATMENT  Most cases of acute sinusitis are related to a viral infection and will resolve on their own within 10 days. Sometimes medicines are prescribed to help relieve symptoms (pain medicine, decongestants, nasal steroid sprays, or saline sprays).  However, for sinusitis related to a bacterial infection, your health care provider will prescribe antibiotic medicines. These are medicines that will help kill the bacteria causing the infection.  Rarely, sinusitis is caused by a fungal infection. In theses cases, your health care provider will prescribe antifungal medicine. For some cases of chronic sinusitis, surgery is needed. Generally, these are cases in which sinusitis recurs more than 3 times per year, despite other treatments. HOME CARE INSTRUCTIONS   Drink plenty of water. Water helps thin the mucus so your sinuses can drain more easily.  Use a humidifier.  Inhale steam 3 to 4 times a day (for example, sit in the bathroom with the shower running).  Apply a warm, moist washcloth to your face 3 to 4 times a day, or as directed by your health care provider.  Use saline nasal sprays to help moisten and clean  your sinuses.  Take medicines only as directed by your health care provider.  If you were prescribed either an antibiotic or antifungal medicine, finish it all even if you start to feel better. SEEK IMMEDIATE MEDICAL CARE IF:  You have increasing pain or severe headaches.  You have nausea, vomiting, or drowsiness.  You have  swelling around your face.  You have vision problems.  You have a stiff neck.  You have difficulty breathing. MAKE SURE YOU:   Understand these instructions.  Will watch your condition.  Will get help right away if you are not doing well or get worse. Document Released: 10/02/2005 Document Revised: 02/16/2014 Document Reviewed: 10/17/2011 [facility] Patient Information 2015 [place], [place]. This information is not intended to replace advice given to you by your health care provider. Make sure you discuss any questions you have with your health care provider.

## 2014-10-19 ENCOUNTER — Other Ambulatory Visit: Payer: Self-pay | Admitting: Family Medicine

## 2014-10-27 ENCOUNTER — Other Ambulatory Visit: Payer: Self-pay

## 2014-10-27 MED ORDER — BUPROPION HCL ER (SR) 200 MG PO TB12: 200.0000 mg | ORAL_TABLET | Freq: Two times a day (BID) | ORAL | Status: DC

## 2014-10-30 ENCOUNTER — Other Ambulatory Visit: Payer: Self-pay | Admitting: Family Medicine

## 2014-11-03 ENCOUNTER — Other Ambulatory Visit: Payer: Self-pay | Admitting: Neurology

## 2014-11-03 NOTE — Telephone Encounter (Signed)
Rx sent 

## 2015-02-08 ENCOUNTER — Other Ambulatory Visit: Payer: Self-pay | Admitting: Physician Assistant

## 2015-03-11 ENCOUNTER — Ambulatory Visit (INDEPENDENT_AMBULATORY_CARE_PROVIDER_SITE_OTHER): Payer: BLUE CROSS/BLUE SHIELD | Admitting: Family Medicine

## 2015-03-11 ENCOUNTER — Encounter: Payer: Self-pay | Admitting: Family Medicine

## 2015-03-11 ENCOUNTER — Telehealth: Payer: Self-pay

## 2015-03-11 VITALS — BP 120/75 | HR 61 | Temp 98.1°F | Resp 16 | Ht 63.0 in | Wt 178.2 lb

## 2015-03-11 DIAGNOSIS — F32A Depression, unspecified: Secondary | ICD-10-CM

## 2015-03-11 DIAGNOSIS — R202 Paresthesia of skin: Secondary | ICD-10-CM | POA: Diagnosis not present

## 2015-03-11 DIAGNOSIS — R10819 Abdominal tenderness, unspecified site: Secondary | ICD-10-CM | POA: Diagnosis not present

## 2015-03-11 DIAGNOSIS — R51 Headache: Secondary | ICD-10-CM | POA: Diagnosis not present

## 2015-03-11 DIAGNOSIS — F329 Major depressive disorder, single episode, unspecified: Secondary | ICD-10-CM

## 2015-03-11 DIAGNOSIS — M545 Low back pain, unspecified: Secondary | ICD-10-CM

## 2015-03-11 DIAGNOSIS — J3489 Other specified disorders of nose and nasal sinuses: Secondary | ICD-10-CM | POA: Diagnosis not present

## 2015-03-11 DIAGNOSIS — R519 Headache, unspecified: Secondary | ICD-10-CM

## 2015-03-11 DIAGNOSIS — M542 Cervicalgia: Secondary | ICD-10-CM

## 2015-03-11 DIAGNOSIS — H938X2 Other specified disorders of left ear: Secondary | ICD-10-CM | POA: Diagnosis not present

## 2015-03-11 LAB — POCT UA - MICROSCOPIC ONLY
Bacteria, U Microscopic: NEGATIVE
Casts, Ur, LPF, POC: NEGATIVE
Crystals, Ur, HPF, POC: NEGATIVE
Epithelial cells, urine per micros: NEGATIVE
Mucus, UA: NEGATIVE
WBC, Ur, HPF, POC: NEGATIVE
Yeast, UA: NEGATIVE

## 2015-03-11 LAB — POCT URINALYSIS DIPSTICK
Bilirubin, UA: NEGATIVE
Glucose, UA: NEGATIVE
Ketones, UA: NEGATIVE
Leukocytes, UA: NEGATIVE
Nitrite, UA: NEGATIVE
Protein, UA: NEGATIVE
Spec Grav, UA: 1.015
Urobilinogen, UA: 0.2
pH, UA: 7.5

## 2015-03-11 NOTE — Progress Notes (Signed)
° °  Subjective:    Patient ID: Tracie Gonzalez, female    DOB: Mar 08, 1966, 49 y.o.   MRN: [id number] This chart was scribed for Elvina SidleKurt Lauenstein, MD by Littie Deedsichard Sun, Medical Scribe. This patient was seen in Room 26 and the patient's care was started at 3:20 PM.  HPI HPI Comments: Tracie Gonzalez is a 49 y.o. female with a hx of cervicogenic headache and depression who [facility] the [facility].   Back Pain: Patient has still been having intermittent back pain for which she wears a back brace. She states the baclofen is not very effective for her. She has been getting physical therapy and acupuncture.   Headache: Patient reports having constant, daily frontal headaches that started 3 months ago.   Tingling: She also reports having intermittent short episodes of tingling to the outside of her upper and lower extremities, described as a pins-and-needles sensation. The tingling episodes can sometimes occur after activities such as putting on makeup. She also reports having some muscle stiffness and intermittent leg swelling. Patient denies weakness.  Nose Injury: Patient reports having difficulty breathing through her left naris since she injured her nose against a wall about a year ago. She did not have any fractures at the time.  Left Ear: She has still been having left ear pressure.  She has associated left nasal obstruction.  Depression: Her depression has improved; she is not crying daily now. She was on Wellbutrin previously, but she has stopped as she did not think it was effective. She notes having more relief by taking Valtrex. Her Ad[facility]all [facility]  She does not have menstrual periods anymore.  Patient works for [employer].  Review of Systems  Constitutional: Positive for fatigue.  Cardiovascular: Positive for leg swelling.  Musculoskeletal: Positive for back pain.  Neurological: Positive for  headaches. Negative for weakness.  Psychiatric/Behavioral: Positive for dysphoric mood.   Patient gets Adderall from Dr Tiburcio PeaHarris Patient [place] Memory DanceHarry Aloi,    Objective:   Physical Exam CONSTITUTIONAL: Well developed/well nourished HEAD: Normocephalic/atraumatic EYES: EOM/PERRL ENMT: Mucous membranes moist NECK: supple no meningeal signs SPINE: entire spine nontender CV: S1/S2 noted, no murmurs/rubs/gallops noted LUNGS: Lungs are clear to auscultation bilaterally, no apparent distress ABDOMEN: Lower abdominal tenderness, no HSM GU: no cva tenderness NEURO: Pt is awake/alert, moves all extremitiesx4 EXTREMITIES: pulses normal, full ROM SKIN: warm, color normal PSYCH: no abnormalities of mood noted  FMLA forms filled out Face to face 45 minutes.    Assessment & Plan:   This chart was scribed in my presence and reviewed by me personally.    ICD-9-CM ICD-10-CM   1. Back pain at L4-L5 level 724.2 M54.5 Sedimentation rate  2. Paresthesias 782.0 R20.2 Vitamin B12     CBC with Differential/Platelet     TSH     CANCELED: POCT SEDIMENTATION RATE  3. Nasal obstruction 478.19 J34.89 Ambulatory referral to ENT  4. Bilateral headaches 784.0 R51   5. Ear pressure, left 388.8 H93.8X2 Ambulatory referral to ENT  6. Depression 311 F32.9 TSH  7. Cervical spine pain 723.1 M54.2   8. Lower abdominal tenderness 789.69 R10.819 POCT urinalysis dipstick     POCT UA - Microscopic Only     Signed, Elvina SidleKurt Lauenstein, MD

## 2015-03-11 NOTE — Telephone Encounter (Signed)
Patient was seen on 03/11/15 with Dr. Milus GlazierLauenstein. Patient presented FMLA paperwork during her office visit. Patient request for us to bill her $15 for the paperwork. Dr. Milus GlazierLauenstein completed the forms for the patient during her office visit. Made copies of the forms. Thanks

## 2015-03-12 ENCOUNTER — Telehealth: Payer: Self-pay

## 2015-03-12 NOTE — Telephone Encounter (Signed)
Clld pt  - LMOVM of cell [phone] [facility]. Left message asking pt to call back regarding 03/11/2015 ov w/Dr. Milus GlazierLauenstein. No labs were obtained. Future orders were put in for pt to come back fasting to have lab work done. Reviewed with Rickard RhymesSheketia Richardson,TL.

## 2015-03-12 NOTE — Addendum Note (Signed)
Addended by: Areta HaberMOREHEAD, Brelan Hannen B on: 03/12/2015 12:34 PM   Modules accepted: Orders

## 2015-03-12 NOTE — Addendum Note (Signed)
Addended by: Areta HaberMOREHEAD, Nina Mondor B on: 03/12/2015 12:48 PM   Modules accepted: Orders

## 2015-04-03 ENCOUNTER — Other Ambulatory Visit: Payer: Self-pay | Admitting: Physician Assistant

## 2015-04-16 DIAGNOSIS — Z0271 Encounter for disability determination: Secondary | ICD-10-CM

## 2015-04-27 ENCOUNTER — Other Ambulatory Visit: Payer: Self-pay | Admitting: Family Medicine

## 2015-05-04 ENCOUNTER — Ambulatory Visit (INDEPENDENT_AMBULATORY_CARE_PROVIDER_SITE_OTHER): Payer: BLUE CROSS/BLUE SHIELD | Admitting: Emergency Medicine

## 2015-05-04 VITALS — BP 118/72 | HR 86 | Temp 98.2°F | Resp 18 | Ht 62.0 in | Wt 177.0 lb

## 2015-05-04 DIAGNOSIS — L237 Allergic contact dermatitis due to plants, except food: Secondary | ICD-10-CM | POA: Diagnosis not present

## 2015-05-04 MED ORDER — TRIAMCINOLONE ACETONIDE 0.1 % EX CREA: 1.0000 "application " | TOPICAL_CREAM | Freq: Two times a day (BID) | CUTANEOUS | Status: DC

## 2015-05-04 NOTE — Patient Instructions (Signed)

## 2015-05-04 NOTE — Progress Notes (Signed)
Subjective:  Patient ID: Tracie Gonzalez, female    DOB: [birth year]  Age: 49 y.o. MRN: [id number]  CC: Poison Ivy and Depression   HPI Tracie Gonzalez presents  patient is a 2 week history of poison ivy. She's been scratching her arms and not sleeping due to the itching. She denies any fever chills or other complaints. She had no relief of her symptoms with over-the-counter medication. She's been using a left over quantity of prednisone for rash and that has not helped.  History Tracie PatterDorcas has a past medical history of Depression and Thyroid disease.   She has past surgical history that includes Abdominal hysterectomy.   Her  family history includes Cancer in her father and mother; Hyperlipidemia in her brother.  She   reports that she has never smoked. She has never used smokeless tobacco. She reports that she drinks alcohol. She reports that she does not use illicit drugs.  Outpatient Prescriptions Prior to Visit  Medication Sig Dispense Refill  . amphetamine-dextroamphetamine (ADDERALL) 30 MG tablet Take 1 tablet (30 mg total) by mouth 2 (two) times daily. Do not fill before 08/02/2012 60 tablet 0  . APPLE CIDER VINEGAR PO Take 2 oz by mouth daily. In water    . Ascorbic Acid (VITAMIN C) 1000 MG tablet Take 1,000 mg by mouth 3 (three) times daily.    . Levothyroxine Sodium 88 MCG CAPS Take by mouth daily before breakfast.    . metaxalone (SKELAXIN) 800 MG tablet TAKE 0.5-1 TABLETS (400-800 MG TOTAL) BY MOUTH 3 (THREE) TIMES DAILY. 30 tablet 0  . Multiple Vitamin (MULTIVITAMIN) tablet Take 1 tablet by mouth daily.    . valACYclovir (VALTREX) 1000 MG tablet TAKE 2 TABLETS TWICE A DAY FOR ACUTE FLARE 40 tablet 9  . COLLAGEN-CHOND-HYALURONIC ACID PO Take by mouth 2 (two) times daily.    Marland Kitchen. OVER THE COUNTER MEDICATION Take 100 mg by mouth daily. R-Lipic Acid    . Thyroid (NATURE-THROID) 48.75 MG TABS TAKE 1 TABLET BY MOUTH DAILY. (Patient not taking: Reported on 03/11/2015) 90 tablet 1  .  Vitamin Mixture (ESTER-C PO) Take 1,000 mg by mouth 3 (three) times daily.     No facility-administered medications prior to visit.    History   Social History  . Marital Status: Single    Spouse Name: N/A  . Number of Children: N/A  . Years of Education: N/A   Social History Main Topics  . Smoking status: Never Smoker   . Smokeless tobacco: Never Used  . Alcohol Use: Yes     Comment: socially  . Drug Use: No  . Sexual Activity: Yes    Birth Control/ Protection: Condom   Other Topics Concern  . None   Social History Narrative   She works at [employer].   She lives alone, no children.     Review of Systems  Constitutional: Negative for fever, chills and appetite change.  HENT: Negative for congestion, ear pain, postnasal drip, sinus pressure and sore throat.   Eyes: Negative for pain and redness.  Respiratory: Negative for cough, shortness of breath and wheezing.   Cardiovascular: Negative for leg swelling.  Gastrointestinal: Negative for nausea, vomiting, abdominal pain, diarrhea, constipation and blood in stool.  Endocrine: Negative for polyuria.  Genitourinary: Negative for dysuria, urgency, frequency and flank pain.  Musculoskeletal: Negative for gait problem.  Skin: Negative for rash.  Neurological: Negative for weakness and headaches.  Psychiatric/Behavioral: Negative for confusion and decreased concentration. The patient  is not nervous/anxious.     Objective:  BP 118/72 mmHg  Pulse 86  Temp(Src) 98.2 F (36.8 C) (Oral)  Resp 18  Ht  (1.575 m)  Wt 177 lb (80.287 kg)  BMI 32.37 kg/m2  SpO2 99%  Physical Exam  Constitutional: She is oriented to person, place, and time. She appears well-developed and well-nourished.  HENT:  Head: Normocephalic and atraumatic.  Eyes: Conjunctivae are normal. Pupils are equal, round, and reactive to light.  Pulmonary/Chest: Effort normal.  Musculoskeletal: She exhibits no edema.  Neurological: She is alert and  oriented to person, place, and time.  Skin: Skin is dry. Rash noted.  Psychiatric: She has a normal mood and affect. Her behavior is normal. Thought content normal.      Assessment & Plan:   Salma was seen today for poison ivy and depression.  Diagnoses and all orders for this visit:  Poison ivy  Other orders -     triamcinolone cream (KENALOG) 0.1 %; Apply 1 application topically 2 (two) times daily.   I have discontinued Ms. Appling's COLLAGEN-CHOND-HYALURONIC ACID PO, Vitamin Mixture (ESTER-C PO), OVER THE COUNTER MEDICATION, and Thyroid. I am also having her start on triamcinolone cream. Additionally, I am having her maintain her amphetamine-dextroamphetamine, multivitamin, vitamin C, APPLE CIDER VINEGAR PO, valACYclovir, Levothyroxine Sodium, and metaxalone.  Meds ordered this encounter  Medications  . triamcinolone cream (KENALOG) 0.1 %    Sig: Apply 1 application topically 2 (two) times daily.    Dispense:  30 g    Refill:  1    Appropriate red flag conditions were discussed with the patient as well as actions that should be taken.  Patient expressed his understanding.  Follow-up: Return if symptoms worsen or fail to improve.  Carmelina Dane, MD

## 2015-06-08 ENCOUNTER — Other Ambulatory Visit: Payer: Self-pay | Admitting: Family Medicine

## 2015-07-18 ENCOUNTER — Other Ambulatory Visit: Payer: Self-pay | Admitting: Family Medicine

## 2016-01-17 ENCOUNTER — Ambulatory Visit (INDEPENDENT_AMBULATORY_CARE_PROVIDER_SITE_OTHER): Payer: BLUE CROSS/BLUE SHIELD | Admitting: Family Medicine

## 2016-01-17 VITALS — BP 120/82 | HR 77 | Temp 97.8°F | Resp 17 | Ht 63.0 in | Wt 181.0 lb

## 2016-01-17 DIAGNOSIS — M545 Low back pain, unspecified: Secondary | ICD-10-CM

## 2016-01-17 DIAGNOSIS — F32A Depression, unspecified: Secondary | ICD-10-CM

## 2016-01-17 DIAGNOSIS — E063 Autoimmune thyroiditis: Secondary | ICD-10-CM

## 2016-01-17 DIAGNOSIS — F329 Major depressive disorder, single episode, unspecified: Secondary | ICD-10-CM | POA: Diagnosis not present

## 2016-01-17 DIAGNOSIS — M791 Myalgia, unspecified site: Secondary | ICD-10-CM

## 2016-01-17 DIAGNOSIS — R5382 Chronic fatigue, unspecified: Secondary | ICD-10-CM | POA: Diagnosis not present

## 2016-01-17 MED ORDER — BUPROPION HCL ER (XL) 300 MG PO TB24: 300.0000 mg | ORAL_TABLET | Freq: Every day | ORAL | Status: DC

## 2016-01-17 NOTE — Progress Notes (Signed)
This a 50 year old single woman with chronic depression for years. It began when she broke up with her previous boyfriend. She just has not been able to get over this.  Patient has chronic myalgias and fatigue with somnolence. She's been missing work for 4 years. She is unable to meet all of her financial obligations because she just can't get going in the morning.  She had tried Wellbutrin and it seemed to help her little while but when it stopped working very well, she abandoned taking it. She is getting Adderall from a different doctor. She says she can't function without the Adderall.  Objective: Patient is tearful. She says she has had some very dark thoughts but would never kill herself as of her religion because of her religion.  Assessment: Chronic major depression.  Plan: Agree to restart the Wellbutrin at a higher dose. She's call me if she's not better in the next 2 weeks. She promises that she will do this and promises that she will not commit suicide.  Furthermore I referred her to Dr. Phillip HealJane Steiner  Signed, Sheila OatsKurt Vikrant Pryce M.D.

## 2016-01-17 NOTE — Patient Instructions (Addendum)
Dr. Phillip HealJane Steiner

## 2016-01-20 ENCOUNTER — Other Ambulatory Visit: Payer: Self-pay | Admitting: Family Medicine

## 2016-01-21 LAB — TSH: TSH: 2.94 mIU/L

## 2016-01-21 LAB — FOLLICLE STIMULATING HORMONE: FSH: 66.2 m[IU]/mL

## 2016-02-14 ENCOUNTER — Telehealth: Payer: Self-pay | Admitting: Family Medicine

## 2016-02-14 DIAGNOSIS — F32A Depression, unspecified: Secondary | ICD-10-CM

## 2016-02-14 DIAGNOSIS — F329 Major depressive disorder, single episode, unspecified: Secondary | ICD-10-CM

## 2016-02-14 NOTE — Telephone Encounter (Signed)
Patient called in to say that her antidepressants are doing ok but she would like to take them twice a day if possible.  She is taking Welbutrin 300 mg and she has taken it twice a day a couple of times and it seem to work good.  Is this a possibility.  [phone]

## 2016-02-15 MED ORDER — BUPROPION HCL ER (XL) 150 MG PO TB24: 150.0000 mg | ORAL_TABLET | Freq: Every day | ORAL | Status: DC

## 2016-02-15 MED ORDER — BUPROPION HCL ER (XL) 300 MG PO TB24: 300.0000 mg | ORAL_TABLET | Freq: Every day | ORAL | Status: DC

## 2016-02-15 NOTE — Telephone Encounter (Signed)
Left message for pt to call back  °

## 2016-02-15 NOTE — Telephone Encounter (Signed)
Maximum recommended dose is 450 mg daily.  I will order.  Do not exceed 450.

## 2016-02-17 NOTE — Telephone Encounter (Signed)
Pt advised.

## 2016-03-28 ENCOUNTER — Ambulatory Visit (INDEPENDENT_AMBULATORY_CARE_PROVIDER_SITE_OTHER): Payer: BLUE CROSS/BLUE SHIELD | Admitting: Physician Assistant

## 2016-03-28 VITALS — BP 126/82 | HR 84 | Temp 98.2°F | Resp 18 | Ht 63.0 in | Wt 183.0 lb

## 2016-03-28 DIAGNOSIS — G8929 Other chronic pain: Secondary | ICD-10-CM | POA: Diagnosis not present

## 2016-03-28 DIAGNOSIS — F32A Depression, unspecified: Secondary | ICD-10-CM | POA: Insufficient documentation

## 2016-03-28 DIAGNOSIS — F329 Major depressive disorder, single episode, unspecified: Secondary | ICD-10-CM | POA: Diagnosis not present

## 2016-03-28 DIAGNOSIS — M545 Low back pain, unspecified: Secondary | ICD-10-CM

## 2016-03-28 MED ORDER — ESCITALOPRAM OXALATE 10 MG PO TABS: 10.0000 mg | ORAL_TABLET | Freq: Every day | ORAL | Status: DC
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
## 2016-03-28 NOTE — Patient Instructions (Addendum)
Please recheck with me in about a month - this will be to check your depression medication addition - please take it for at least a month so we can see if it is going to help - this is a low dose and it can be increased in the future  For therapy -- [facility] [address]Beth Vickie EpleyKincaid, Heather Joycelyn SchmidKitchens, Karla Arabiownsend) - [phone] [facility]**[facility] ([address]Julie Whitt, Terri BauretChipley** [place] [facility]) - [phone] [facility] - [phone] [facility] - [phone]  IF you received an x-ray today, you will receive an invoice from [facility]. Please contact [facility] at [phone] with questions or concerns regarding your invoice.   IF you received labwork today, you will receive an invoice from [facility]. Please contact [facility] at [phone] with questions or concerns regarding your invoice.   Our billing staff will not be able to assist you with questions regarding bills from these companies.  You will be contacted with the lab results as soon as they are available. The fastest way to get your results is to activate your My Chart account. Instructions are located on the last page of this paperwork. If you have not heard from us regarding the results in 2 weeks, please contact this office.    We recommend that you schedule a mammogram for breast cancer screening. Typically, you do not need a referral to do this. Please contact a local imaging center to schedule your mammogram.  [facility] - [phone]  *ask for the Radiology Department [facility]) - [phone] or [phone]  [facility] - [phone] [facility] - [phone] [facility] - [phone]  *ask for the Radiology Department [facility] - [phone]  *ask for the Radiology Department [facility] -  [phone]  *ask for the Mammography Department [facility] - [phone]

## 2016-04-05 ENCOUNTER — Telehealth: Payer: Self-pay

## 2016-04-05 NOTE — Telephone Encounter (Signed)
Pt is checking on her fmla paperwork   Best number [phone]

## 2016-04-06 NOTE — Telephone Encounter (Signed)
Patient requested that I send a message to Maralyn SagoSarah regarding FMLA. She wants to know when it'll be ready and if she pick it up or will it be faxed.

## 2016-04-07 NOTE — Telephone Encounter (Signed)
Can you check on these

## 2016-04-10 NOTE — Telephone Encounter (Signed)
I have been on vacation for the past few days but I do not have any paperwork for this patient at all, she will need to have her employer re-fax it to us or bring in a blank copy.

## 2016-05-03 ENCOUNTER — Telehealth: Payer: Self-pay

## 2016-05-03 NOTE — Telephone Encounter (Signed)
Pt is needing to talk with someone about side effects of lexapro-she is experiencing weakness and fatigue muscles jerking and no energy   Best number [phone]

## 2016-05-04 NOTE — Telephone Encounter (Signed)
Advised pt

## 2016-05-04 NOTE — Telephone Encounter (Signed)
Tracie SagoSarah do you want pt to come in?  I tried to call pt but no answer. Left message for pt to call back.

## 2016-05-04 NOTE — Telephone Encounter (Signed)
If she has taken the medication for less than 3 weeks if she can try to do it another week these symptoms may stop with a little time.  If her wants to stop the Lexapro - she should take 1/2 pills for 5 days and then 1/2 pills every other day for 5 days to stop the medications - she should RTC to discuss the next step - please also see if she has started seeing a therapist

## 2016-05-12 ENCOUNTER — Ambulatory Visit (INDEPENDENT_AMBULATORY_CARE_PROVIDER_SITE_OTHER): Payer: BLUE CROSS/BLUE SHIELD | Admitting: Physician Assistant

## 2016-05-12 VITALS — BP 120/70 | HR 78 | Temp 98.5°F | Resp 16 | Ht 63.0 in | Wt 183.2 lb

## 2016-05-12 DIAGNOSIS — F32A Depression, unspecified: Secondary | ICD-10-CM

## 2016-05-12 DIAGNOSIS — F329 Major depressive disorder, single episode, unspecified: Secondary | ICD-10-CM | POA: Diagnosis not present

## 2016-05-12 MED ORDER — CITALOPRAM HYDROBROMIDE 20 MG PO TABS: 20.0000 mg | ORAL_TABLET | Freq: Every day | ORAL | 0 refills | Status: DC

## 2016-05-12 NOTE — Patient Instructions (Addendum)
  Please stop the Lexapro and start the Celexa tonight - start 1/2 pill daily for the next week and then increase to a full pill   IF you received an x-ray today, you will receive an invoice from [facility]. Please contact [facility] at [phone] with questions or concerns regarding your invoice.   IF you received labwork today, you will receive an invoice from [facility]. Please contact [facility] at [phone] with questions or concerns regarding your invoice.   Our billing staff will not be able to assist you with questions regarding bills from these companies.  You will be contacted with the lab results as soon as they are available. The fastest way to get your results is to activate your My Chart account. Instructions are located on the last page of this paperwork. If you have not heard from [place] regarding the results in 2 weeks, please contact this office.

## 2016-05-12 NOTE — Progress Notes (Signed)
   Tracie Gonzalez  MRN: [id number] DOB: 1966/08/13  Subjective:  Pt presents to clinic for medication recheck.  She has started the lexapro and really thinks it is helping - less teary and feels much better - she does not want to continue the lexapro but she does want to try another medication in hopes that her improvement will continue.  Review of Systems  Constitutional: Negative for chills and fever.  Psychiatric/Behavioral: Positive for dysphoric mood.    Patient Active Problem List   Diagnosis Date Noted  . Chronic low back pain 03/28/2016  . Depression 03/28/2016  . Hashimoto's disease 08/21/2014  . Cervicogenic headache 10/30/2013    Current Outpatient Prescriptions on File Prior to Visit  Medication Sig Dispense Refill  . amphetamine-dextroamphetamine (ADDERALL) 30 MG tablet Take 1 tablet (30 mg total) by mouth 2 (two) times daily. Do not fill before 08/02/2012 60 tablet 0  . APPLE CIDER VINEGAR PO Take 2 oz by mouth daily. In water    . Ascorbic Acid (VITAMIN C) 1000 MG tablet Take 1,000 mg by mouth 3 (three) times daily.    Marland Kitchen buPROPion (WELLBUTRIN XL) 150 MG 24 hr tablet Take 1 tablet (150 mg total) by mouth daily. 90 tablet 3  . buPROPion (WELLBUTRIN XL) 300 MG 24 hr tablet Take 1 tablet (300 mg total) by mouth daily. 90 tablet 3  . escitalopram (LEXAPRO) 10 MG tablet Take 1 tablet (10 mg total) by mouth daily. 30 tablet 1  . etodolac (LODINE) 400 MG tablet Take 400 mg by mouth.    . Levothyroxine Sodium 88 MCG CAPS Take by mouth daily before breakfast. Reported on 01/17/2016    . minocycline (DYNACIN) 100 MG tablet Take 100 mg by mouth as needed.    . Multiple Vitamin (MULTIVITAMIN) tablet Take 1 tablet by mouth daily.    . NON FORMULARY     . triamcinolone cream (KENALOG) 0.1 % Apply 1 application topically 2 (two) times daily. 30 g 1  . valACYclovir (VALTREX) 1000 MG tablet TAKE 2 TABLETS TWICE A DAY FOR ACUTE FLARE 40 tablet 6   No current facility-administered  medications on file prior to visit.     No Known Allergies  Objective:  BP 120/70 (BP Location: Left Arm, Patient Position: Sitting, Cuff Size: Normal)   Pulse 78   Temp 98.5 F (36.9 C) (Oral)   Resp 16   Ht 5\' 3"  (1.6 m)   Wt 183 lb 3.2 oz (83.1 kg)   SpO2 99%   BMI 32.45 kg/m   Physical Exam  Constitutional: She is oriented to person, place, and time and well-developed, well-nourished, and in no distress.  HENT:  Head: Normocephalic and atraumatic.  Right Ear: Hearing and external ear normal.  Left Ear: Hearing and external ear normal.  Eyes: Conjunctivae are normal.  Neck: Normal range of motion.  Pulmonary/Chest: Effort normal.  Neurological: She is alert and oriented to person, place, and time. Gait normal.  Skin: Skin is warm and dry.  Psychiatric: Mood, memory, affect and judgment normal.  Vitals reviewed.   Assessment and Plan :  Depression - Plan: citalopram (CELEXA) 20 MG tablet   She will switch from lexapro to celexa -- and we will recheck in 6 weeks - if this does not help we will think about cymbalta in the future which may help with her chronic pain also  Benny Lennert PA-C  [facility] 05/12/2016 6:25 PM

## 2016-05-24 ENCOUNTER — Other Ambulatory Visit: Payer: Self-pay | Admitting: Physician Assistant

## 2016-05-24 DIAGNOSIS — F329 Major depressive disorder, single episode, unspecified: Secondary | ICD-10-CM

## 2016-05-24 DIAGNOSIS — F32A Depression, unspecified: Secondary | ICD-10-CM

## 2016-06-08 ENCOUNTER — Ambulatory Visit (INDEPENDENT_AMBULATORY_CARE_PROVIDER_SITE_OTHER): Payer: BLUE CROSS/BLUE SHIELD | Admitting: Physician Assistant

## 2016-06-08 VITALS — BP 120/86 | HR 81 | Temp 98.0°F | Resp 18 | Ht 62.0 in | Wt 184.0 lb

## 2016-06-08 DIAGNOSIS — F329 Major depressive disorder, single episode, unspecified: Secondary | ICD-10-CM | POA: Diagnosis not present

## 2016-06-08 DIAGNOSIS — F32A Depression, unspecified: Secondary | ICD-10-CM

## 2016-06-08 MED ORDER — DESVENLAFAXINE SUCCINATE ER 25 MG PO TB24: 25.0000 mg | ORAL_TABLET | Freq: Every day | ORAL | 0 refills | Status: DC

## 2016-06-08 MED ORDER — DESVENLAFAXINE SUCCINATE ER 50 MG PO TB24: 50.0000 mg | ORAL_TABLET | Freq: Every day | ORAL | 0 refills | Status: DC

## 2016-06-08 NOTE — Patient Instructions (Addendum)
Start with Pristiq 25mg  a day for 2 weeks and then increase to 50mg  a day -- at the end of the bottle you will have a Rx for Pristiq 50mg   At the pharmacy -- I will see you in 5 weeks    IF you received an x-ray today, you will receive an invoice from [facility]. Please contact [facility] at [phone] with questions or concerns regarding your invoice.   IF you received labwork today, you will receive an invoice from [facility]. Please contact [facility] at [phone] with questions or concerns regarding your invoice.   Our billing staff will not be able to assist you with questions regarding bills from these companies.  You will be contacted with the lab results as soon as they are available. The fastest way to get your results is to activate your My Chart account. Instructions are located on the last page of this paperwork. If you have not heard from us regarding the results in 2 weeks, please contact this office.
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
## 2016-06-10 ENCOUNTER — Encounter: Payer: Self-pay | Admitting: Physician Assistant

## 2016-08-10 ENCOUNTER — Other Ambulatory Visit: Payer: Self-pay | Admitting: Physician Assistant

## 2016-08-10 DIAGNOSIS — F329 Major depressive disorder, single episode, unspecified: Secondary | ICD-10-CM

## 2016-08-10 DIAGNOSIS — F32A Depression, unspecified: Secondary | ICD-10-CM

## 2016-09-06 ENCOUNTER — Telehealth: Payer: Self-pay

## 2016-09-06 NOTE — Telephone Encounter (Signed)
Pt is calling says she is seeing a therapist now and the medication for her depression is working and would like a refill on it I told her she was probably going to need to come in an there was a note saying but she doesn't understand why since she is seeing a therapist now so I told her I would still put in a message   Contact [phone]

## 2016-09-11 NOTE — Telephone Encounter (Signed)
Pt is calling again stating she really needs a refill and can not come in for an OV I have told her it is in the notes that she needs to be seen but she says she cannot make it in and doesn't understand why she needs an ov   Please advise [phone]

## 2016-09-12 NOTE — Telephone Encounter (Signed)
Left message to schedule ov before refills

## 2016-09-15 ENCOUNTER — Ambulatory Visit (INDEPENDENT_AMBULATORY_CARE_PROVIDER_SITE_OTHER): Payer: BLUE CROSS/BLUE SHIELD | Admitting: Physician Assistant

## 2016-09-15 VITALS — BP 130/80 | HR 83 | Temp 98.3°F | Resp 17 | Ht 62.5 in | Wt 189.0 lb

## 2016-09-15 DIAGNOSIS — F321 Major depressive disorder, single episode, moderate: Secondary | ICD-10-CM | POA: Diagnosis not present

## 2016-09-15 MED ORDER — DESVENLAFAXINE SUCCINATE ER 100 MG PO TB24: 100.0000 mg | ORAL_TABLET | Freq: Every day | ORAL | 1 refills | Status: DC

## 2016-09-15 NOTE — Progress Notes (Signed)
Tracie Gonzalez  MRN: [id number] DOB: Nov 20, 1965  Subjective:  Pt presents to clinic for recheck of mediations.  She is doing great on the Pristiq - she is crying less and feels more hope.  She has been seeing a therapist who has told her she has major depressive disorder.  She is missing less work though she is still missing work.  She is thinking about getting set up with a psychiatrist at work.  She decreased her Wellbutrin from 450-300mg  and she feels like that decrease did not cause her any problems.    Therapy - Tracie Gonzalez - through [facility]  Review of Systems  Psychiatric/Behavioral: Positive for dysphoric mood. Negative for self-injury.    Patient Active Problem List   Diagnosis Date Noted  . Chronic low back pain 03/28/2016  . Depression 03/28/2016  . Hashimoto's disease 08/21/2014  . Cervicogenic headache 10/30/2013    Current Outpatient Prescriptions on File Prior to Visit  Medication Sig Dispense Refill  . amphetamine-dextroamphetamine (ADDERALL) 30 MG tablet Take 1 tablet (30 mg total) by mouth 2 (two) times daily. Do not fill before 08/02/2012 60 tablet 0  . APPLE CIDER VINEGAR PO Take 2 oz by mouth daily. In water    . Ascorbic Acid (VITAMIN C) 1000 MG tablet Take 1,000 mg by mouth 3 (three) times daily.    Marland Kitchen. buPROPion (WELLBUTRIN XL) 300 MG 24 hr tablet Take 1 tablet (300 mg total) by mouth daily. 90 tablet 3  . etodolac (LODINE) 400 MG tablet Take 400 mg by mouth.    . Levothyroxine Sodium 88 MCG CAPS Take by mouth daily before breakfast. Reported on 01/17/2016    . minocycline (DYNACIN) 100 MG tablet Take 100 mg by mouth as needed.    . Multiple Vitamin (MULTIVITAMIN) tablet Take 1 tablet by mouth daily.    . NON FORMULARY     . triamcinolone cream (KENALOG) 0.1 % Apply 1 application topically 2 (two) times daily. 30 g 1  . valACYclovir (VALTREX) 1000 MG tablet TAKE 2 TABLETS TWICE A DAY FOR ACUTE FLARE 40 tablet 6   No current facility-administered medications on file  prior to visit.     No Known Allergies  Pt patients past, family and social history were reviewed and updated.   Objective:  BP 130/80 (BP Location: Right Arm, Patient Position: Sitting, Cuff Size: Normal)   Pulse 83   Temp 98.3 F (36.8 C) (Oral)   Resp 17   Ht 5' 2.5" (1.588 m)   Wt 189 lb (85.7 kg)   SpO2 97%   BMI 34.02 kg/m   Physical Exam  Constitutional: She is oriented to person, place, and time and well-developed, well-nourished, and in no distress.  HENT:  Head: Normocephalic and atraumatic.  Right Ear: Hearing and external ear normal.  Left Ear: Hearing and external ear normal.  Eyes: Conjunctivae are normal.  Neck: Normal range of motion.  Pulmonary/Chest: Effort normal.  Neurological: She is alert and oriented to person, place, and time. Gait normal.  Skin: Skin is warm and dry.  Psychiatric: Mood, memory, affect and judgment normal.  Vitals reviewed.   Assessment and Plan :  Moderate single current episode of major depressive disorder (HCC) - Plan: desvenlafaxine (PRISTIQ) 100 MG 24 hr tablet - pt is doing really well on Pristiq - she still is missing work but she is better - we will increase her medication to 100mg  and she will recheck with me in 2 months or she will switch  to the psychiatrist.  Tracie LennertSarah Weber PA-C  [facility] 09/19/2016 2:50 PM

## 2016-09-15 NOTE — Patient Instructions (Signed)
     IF you received an x-ray today, you will receive an invoice from [facility]. Please contact  Radiology at [phone] with questions or concerns regarding your invoice.   IF you received labwork today, you will receive an invoice from [facility]. Please contact [facility] at [phone] with questions or concerns regarding your invoice.   Our billing staff will not be able to assist you with questions regarding bills from these companies.  You will be contacted with the lab results as soon as they are available. The fastest way to get your results is to activate your My Chart account. Instructions are located on the last page of this paperwork. If you have not heard from us regarding the results in 2 weeks, please contact this office.      

## 2016-10-26 ENCOUNTER — Other Ambulatory Visit: Payer: Self-pay | Admitting: Family Medicine

## 2016-10-29 NOTE — Telephone Encounter (Signed)
09/2016 last ov Last refill 07/2015

## 2016-11-09 ENCOUNTER — Ambulatory Visit (INDEPENDENT_AMBULATORY_CARE_PROVIDER_SITE_OTHER): Payer: BLUE CROSS/BLUE SHIELD | Admitting: Physician Assistant

## 2016-11-09 VITALS — BP 110/70 | HR 82 | Temp 98.2°F | Resp 18 | Ht 62.5 in | Wt 191.4 lb

## 2016-11-09 DIAGNOSIS — S7010XA Contusion of unspecified thigh, initial encounter: Secondary | ICD-10-CM

## 2016-11-09 DIAGNOSIS — S7000XA Contusion of unspecified hip, initial encounter: Secondary | ICD-10-CM

## 2016-11-09 DIAGNOSIS — J019 Acute sinusitis, unspecified: Secondary | ICD-10-CM | POA: Diagnosis not present

## 2016-11-09 MED ORDER — MELOXICAM 15 MG PO TABS
15.0000 mg | ORAL_TABLET | Freq: Every day | ORAL | 0 refills | Status: DC
Start: 2016-11-09 — End: 2017-01-03

## 2016-11-09 NOTE — Patient Instructions (Addendum)
Please take the mobic.  Do not take ibuprofen or naproxen.  You are able to take tylenol.   Ice the back three times per day for 15 minutes.    Please use the nasal rinse and mucinex. This can be 1200 mg every 12 hours of the guaifenesin.    IF you received an x-ray today, you will receive an invoice from [facility]. Please contact [facility] at [phone] with questions or concerns regarding your invoice.   IF you received labwork today, you will receive an invoice from [facility]. Please contact [facility] at [phone] with questions or concerns regarding your invoice.   Our billing staff will not be able to assist you with questions regarding bills from these companies.  You will be contacted with the lab results as soon as they are available. The fastest way to get your results is to activate your My Chart account. Instructions are located on the last page of this paperwork. If you have not heard from us regarding the results in 2 weeks, please contact this office.

## 2016-11-09 NOTE — Progress Notes (Signed)
[facility] [address] [place] [address] [phone]  Date:  11/09/2016   Name:  PAYSEN GOZA   DOB:  1966-01-21   MRN:  [id number]  PCP:  No primary care provider on file.    History of Present Illness:  RAMAH LANGHANS is a 51 y.o. female patient who presents to [facility] for cc of right sided hip pain.     --pain on the right side of her hips.  No numbness or tingling.  No redness or swelling.   --fell on the ice 1 week ago.  Stopped herself.   She took aspirin which did not help.   --felt queasy.  Soaked in epsom salt.  As she was working, she developed pain at her back pain.  She did not go in yesterday.  Aggravated with walking.  She has to walk on the hard cement.    4-5 months, with phlegm.  Throat raw at times.  Feels like something is in the back of her throat.  No heavy discolored mucus.    Patient Active Problem List   Diagnosis Date Noted  . Chronic low back pain 03/28/2016  . Depression 03/28/2016  . Hashimoto's disease 08/21/2014  . Cervicogenic headache 10/30/2013    Past Medical History:  Diagnosis Date  . Depression   . Thyroid disease     Past Surgical History:  Procedure Laterality Date  . ABDOMINAL HYSTERECTOMY      Social History  Substance Use Topics  . Smoking status: Never Smoker  . Smokeless tobacco: Never Used  . Alcohol use No     Comment: socially    Family History  Problem Relation Age of Onset  . Cancer Mother     Deceased, 48  . Cancer Father     Deceased, 75  . Hyperlipidemia Brother     No Known Allergies  Medication list has been reviewed and updated.  Current Outpatient Prescriptions on File Prior to Visit  Medication Sig Dispense Refill  . amphetamine-dextroamphetamine (ADDERALL) 30 MG tablet Take 1 tablet (30 mg total) by mouth 2 (two) times daily. Do not fill before 08/02/2012 60 tablet 0  . APPLE CIDER VINEGAR PO Take 2 oz by mouth daily. In water    . Ascorbic Acid (VITAMIN C) 1000 MG  tablet Take 1,000 mg by mouth 3 (three) times daily.    Marland Kitchen buPROPion (WELLBUTRIN XL) 300 MG 24 hr tablet Take 1 tablet (300 mg total) by mouth daily. 90 tablet 3  . desvenlafaxine (PRISTIQ) 100 MG 24 hr tablet Take 1 tablet (100 mg total) by mouth daily. 30 tablet 1  . etodolac (LODINE) 400 MG tablet Take 400 mg by mouth.    . minocycline (DYNACIN) 100 MG tablet Take 100 mg by mouth as needed.    . Multiple Vitamin (MULTIVITAMIN) tablet Take 1 tablet by mouth daily.    . NON FORMULARY     . triamcinolone cream (KENALOG) 0.1 % Apply 1 application topically 2 (two) times daily. 30 g 1  . valACYclovir (VALTREX) 1000 MG tablet TAKE 2 TABLETS TWICE A DAY FOR ACUTE FLARE 40 tablet 0   No current facility-administered medications on file prior to visit.     ROS ROS otherwise unremarkable unless listed above.    Physical Examination: BP 110/70   Pulse 82   Temp 98.2 F (36.8 C) (Oral)   Resp 18   Ht 5' 2.5" (1.588 m)   Wt 191 lb 6.4  oz (86.8 kg)   SpO2 98%   BMI 34.45 kg/m  Ideal Body Weight: Weight in (lb) to have BMI = 25: 138.6  Physical Exam  Constitutional: She is oriented to person, place, and time. She appears well-developed and well-nourished. No distress.  HENT:  Head: Normocephalic and atraumatic.  Right Ear: Tympanic membrane, external ear and ear canal normal.  Left Ear: Tympanic membrane, external ear and ear canal normal.  Nose: Mucosal edema and rhinorrhea present. Right sinus exhibits no maxillary sinus tenderness and no frontal sinus tenderness. Left sinus exhibits no maxillary sinus tenderness and no frontal sinus tenderness.  Mouth/Throat: No uvula swelling. No oropharyngeal exudate, posterior oropharyngeal edema or posterior oropharyngeal erythema.  Eyes: Conjunctivae and EOM are normal. Pupils are equal, round, and reactive to light.  Cardiovascular: Normal rate and regular rhythm.  Exam reveals no gallop, no distant heart sounds and no friction rub.   No murmur  heard. Pulmonary/Chest: Effort normal. No respiratory distress. She has no decreased breath sounds. She has no wheezes. She has no rhonchi.  Musculoskeletal:  Right thigh tenderness upon palpation.  No decrease rom resisted, or active.  No erythema or ecchymosis.    Lymphadenopathy:       Head (right side): No submandibular, no tonsillar, no preauricular and no posterior auricular adenopathy present.       Head (left side): No submandibular, no tonsillar, no preauricular and no posterior auricular adenopathy present.  Neurological: She is alert and oriented to person, place, and time.  Skin: She is not diaphoretic.  Psychiatric: She has a normal mood and affect. Her behavior is normal.     Assessment and Plan: Shon HaleDorcas M Scullion is a 51 y.o. female who is here today for cc of right thigh pain and sinus issues. Appears to be a lateral thigh contusion.  Advised icing.  Anti-inflammatory and precautions instructed. Advised nasal rinse and mucinex.    Contusion of hip and thigh, initial encounter - Plan: meloxicam (MOBIC) 15 MG tablet  Acute non-recurrent sinusitis, unspecified location  Trena PlattStephanie Rosamund Nyland, PA-C [facility] 1/28/20186:03 PM

## 2016-11-27 ENCOUNTER — Other Ambulatory Visit: Payer: Self-pay | Admitting: Physician Assistant

## 2016-11-27 DIAGNOSIS — F321 Major depressive disorder, single episode, moderate: Secondary | ICD-10-CM

## 2016-12-06 ENCOUNTER — Ambulatory Visit (INDEPENDENT_AMBULATORY_CARE_PROVIDER_SITE_OTHER): Payer: BLUE CROSS/BLUE SHIELD | Admitting: Physician Assistant

## 2016-12-06 VITALS — BP 102/60 | HR 89 | Temp 97.4°F | Resp 18 | Ht 62.5 in | Wt 194.4 lb

## 2016-12-06 DIAGNOSIS — G8929 Other chronic pain: Secondary | ICD-10-CM

## 2016-12-06 DIAGNOSIS — R200 Anesthesia of skin: Secondary | ICD-10-CM | POA: Diagnosis not present

## 2016-12-06 DIAGNOSIS — F321 Major depressive disorder, single episode, moderate: Secondary | ICD-10-CM

## 2016-12-06 DIAGNOSIS — M545 Low back pain: Secondary | ICD-10-CM

## 2016-12-06 MED ORDER — ACETAMINOPHEN-PAMABROM 500-25 MG PO TABS: 1000.0000 mg | ORAL_TABLET | Freq: Four times a day (QID) | ORAL | 0 refills | Status: DC | PRN

## 2016-12-06 NOTE — Patient Instructions (Addendum)
PT will call you to schedule an appt.  Please follow-up with me in 4-6 weeks. Stay well hydrated before your PT appt. Drink 2-3 liters of water/day. If they decide to do dry-needling, they will not do this unless you are hydrated.  Start exercising and stretching as soon as you can.   Thank you for coming in today. I hope you feel we met your needs.  Feel free to call [facility] if you have any questions or further requests.  Please consider signing up for MyChart if you do not already have it, as this is a great way to communicate with me.  Best,  Whitney McVey, PA-C    IF you received an x-ray today, you will receive an invoice from [facility]. Please contact [facility] at [phone] with questions or concerns regarding your invoice.   IF you received labwork today, you will receive an invoice from [facility]. Please contact [facility] at [phone] with questions or concerns regarding your invoice.   Our billing staff will not be able to assist you with questions regarding bills from these companies.  You will be contacted with the lab results as soon as they are available. The fastest way to get your results is to activate your My Chart account. Instructions are located on the last page of this paperwork. If you have not heard from [place] regarding the results in 2 weeks, please contact this office.
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
## 2016-12-07 ENCOUNTER — Ambulatory Visit (INDEPENDENT_AMBULATORY_CARE_PROVIDER_SITE_OTHER): Payer: BLUE CROSS/BLUE SHIELD | Admitting: Physician Assistant

## 2016-12-07 VITALS — BP 112/70 | HR 78 | Temp 98.0°F | Resp 18 | Ht 62.5 in | Wt 194.4 lb

## 2016-12-07 DIAGNOSIS — G479 Sleep disorder, unspecified: Secondary | ICD-10-CM

## 2016-12-07 DIAGNOSIS — F321 Major depressive disorder, single episode, moderate: Secondary | ICD-10-CM | POA: Diagnosis not present

## 2016-12-07 DIAGNOSIS — R4589 Other symptoms and signs involving emotional state: Secondary | ICD-10-CM

## 2016-12-07 NOTE — Patient Instructions (Addendum)
[employer] [place] [employer] ([employer]) is a Theme park managerCommunity Partner of Pet Partners (formerly Civil engineer, contractingDelta Society). Pet Partners is the leader in demonstrating and promoting positive human-animal interaction to improve the physical, emotional, and psychological lives of those we serve. NCPP includes registered Systems analystet Partners Therapy Animal Teams (handlers and their animals) in central and [place]. NCPP also includes individuals interested in the Therapy Animals program who may not be registered Psychologist, forensicet Partners Therapy Animal teams  NCPP recommends two excellent dog obedience training facilities: [facility], [address], [place], [place] [address] (class location), Phone [phone], [email or web address]. [address], [address], [place], [place] [address], Phone [phone], [email or web address] - Contact: Earmon PhoenixJan Wilson. -------- Below are several facilities providing service opportunities for Registered Psychologist, forensicet Partners Therapy Animal Teams. Each of these facilities has current NCPP/Pet Partners policies and procedures established.  [facility] - contact Tobias AlexanderGail Moss at [email or web address]. [facility] [facility] Facilities - contact Fifth Third BancorpChris Hellier at [email or web address].  [facility] Visitation - contact Otelia SergeantJan Donahue at [email or web address] or Lindaann SloughKaren Gibson at [email or web address].  [facility] - contact Tobias AlexanderGail Moss at [email or web address]. [facility] - contact Lindaann SloughKaren Gibson at [email or web address]; (Includes [place]. [facility], [facility], [facility], [facility], [facility]). [facility] - contact Beth Kuoni at [email or web address]. [facility] - contact Lindaann SloughKaren Gibson at [email or web address]. [facility] - contact Lindaann SloughKaren Gibson at [email or web address]. [email or web address] [employer]County Schools Reading Program -  contact Florene GlenDonna Ford at [email or web address]. Penny[facility]rn at [facility] - contact Reliant Energyeacie Daniel at [email or web address]. [facility] [place] - contact Lindaann SloughKaren Gibson at [email or web address]. [facility] - contact Otelia SergeantJan Donahue at [email or web address] [email or web address] or Buddy DutyChris Scofield at [email or web address]. Registered Psychologist, forensicet Partners Therapy Animal Teams have also provided visits at the following facilities: [facility]en [facility] [address] [facility] [place] [place] [facility]    IF you received an x-ray today, you will receive an invoice from [facility]. Please contact [facility] at [phone] with questions or concerns regarding your invoice.   IF you received labwork today, you will receive an invoice from [facility]. Please contact [facility] at [phone] with questions or concerns regarding your invoice.   Our billing staff will not be able to assist you with questions regarding bills from these companies.  You will be contacted with the lab results as soon as they are available. The fastest way to get your results is to activate your My Chart account. Instructions are located on the last page of this paperwork. If you have not heard from us regarding the results in 2 weeks, please contact this office.
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
## 2016-12-08 ENCOUNTER — Other Ambulatory Visit: Payer: Self-pay

## 2016-12-08 DIAGNOSIS — F321 Major depressive disorder, single episode, moderate: Secondary | ICD-10-CM

## 2016-12-08 NOTE — Telephone Encounter (Signed)
This should be approved/denied by Benny LennertSarah Weber. It appears Pt needs appt. Maralyn SagoSarah last filled this Rx 09/2016 and asked pt to f/u in 2 months.

## 2016-12-08 NOTE — Telephone Encounter (Signed)
Fax req [facility] [address] Desvenlafaxine ER 100mg  Sent to [facility]

## 2016-12-11 MED ORDER — DESVENLAFAXINE SUCCINATE ER 100 MG PO TB24: 100.0000 mg | ORAL_TABLET | Freq: Every day | ORAL | 0 refills | Status: DC

## 2016-12-11 NOTE — Telephone Encounter (Signed)
Please call the patient and tell her that I have refilled her medication but she will need to schedule and appt before more refills are given.

## 2016-12-11 NOTE — Telephone Encounter (Signed)
Attempted to call, no vm.

## 2016-12-12 ENCOUNTER — Encounter: Payer: Self-pay | Admitting: Physician Assistant

## 2016-12-12 NOTE — Telephone Encounter (Signed)
I contacted pt through my chart

## 2016-12-15 ENCOUNTER — Ambulatory Visit (INDEPENDENT_AMBULATORY_CARE_PROVIDER_SITE_OTHER): Payer: BLUE CROSS/BLUE SHIELD | Admitting: Physician Assistant

## 2016-12-15 VITALS — BP 112/74 | HR 93 | Temp 97.6°F | Resp 18 | Ht 62.5 in | Wt 193.2 lb

## 2016-12-15 DIAGNOSIS — F321 Major depressive disorder, single episode, moderate: Secondary | ICD-10-CM | POA: Diagnosis not present

## 2016-12-15 DIAGNOSIS — G479 Sleep disorder, unspecified: Secondary | ICD-10-CM

## 2016-12-15 MED ORDER — HYDROXYZINE HCL 50 MG PO TABS: 50.0000 mg | ORAL_TABLET | Freq: Every evening | ORAL | 1 refills | Status: DC | PRN

## 2016-12-15 NOTE — Patient Instructions (Addendum)
1) Try Melatonin. 10 mg 1 hour before bedtime.  2) If this does not work, take Atarax as directed.   Try any combination of these to help you sleep: Try calming things to get ready for bed.  - Download "calm" app on your phone. There are bedtime stories on this app as well as meditation  - Make an appointment with an acupuncturist: [place] or [place] before bed. Down Dog app - Turn off your TV 2 hours before bed. Read the Bible - Get a sound machine and turn it on at night in your room while you sleep.  - Try to get out and exercise at least 20 minutes a day.    Thank you for coming in today. I hope you feel we met your needs.  Feel free to call [facility] if you have any questions or further requests.  Please consider signing up for MyChart if you do not already have it, as this is a great way to communicate with me.  Best,  Whitney McVey, PA-C  Insomnia Insomnia is a sleep disorder that makes it difficult to fall asleep or to stay asleep. Insomnia can cause tiredness (fatigue), low energy, difficulty concentrating, mood swings, and poor performance at work or school. There are three different ways to classify insomnia:  Difficulty falling asleep.  Difficulty staying asleep.  Waking up too early in the morning. Any type of insomnia can be long-term (chronic) or short-term (acute). Both are common. Short-term insomnia usually lasts for three months or less. Chronic insomnia occurs at least three times a week for longer than three months. What are the causes? Insomnia may be caused by another condition, situation, or substance, such as:  Anxiety.  Certain medicines.  Gastroesophageal reflux disease (GERD) or other gastrointestinal conditions.  Asthma or other breathing conditions.  Restless legs syndrome, sleep apnea, or other sleep disorders.  Chronic pain.  Menopause. This may include hot flashes.  Stroke.  Abuse of alcohol, tobacco, or  illegal drugs.  Depression.  Caffeine.  Neurological disorders, such as Alzheimer disease.  An overactive thyroid (hyperthyroidism). The cause of insomnia may not be known. What increases the risk? Risk factors for insomnia include:  Gender. Women are more commonly affected than men.  Age. Insomnia is more common as you get older.  Stress. This may involve your professional or personal life.  Income. Insomnia is more common in people with lower income.  Lack of exercise.  Irregular work schedule or night shifts.  Traveling between different time zones. What are the signs or symptoms? If you have insomnia, trouble falling asleep or trouble staying asleep is the main symptom. This may lead to other symptoms, such as:  Feeling fatigued.  Feeling nervous about going to sleep.  Not feeling rested in the morning.  Having trouble concentrating.  Feeling irritable, anxious, or depressed. How is this treated? Treatment for insomnia depends on the cause. If your insomnia is caused by an underlying condition, treatment will focus on addressing the condition. Treatment may also include:  Medicines to help you sleep.  Counseling or therapy.  Lifestyle adjustments. Follow these instructions at home:  Take medicines only as directed by your health care provider.  Keep regular sleeping and waking hours. Avoid naps.  Keep a sleep diary to help you and your health care provider figure out what could be causing your insomnia. Include:  When you sleep.  When you wake up during the night.  How well you sleep.  How rested you feel the next day.  Any side effects of medicines you are taking.  What you eat and drink.  Make your bedroom a comfortable place where it is easy to fall asleep:  Put up shades or special blackout curtains to block light from outside.  Use a white noise machine to block noise.  Keep the temperature cool.  Exercise regularly as directed by  your health care provider. Avoid exercising right before bedtime.  Use relaxation techniques to manage stress. Ask your health care provider to suggest some techniques that may work well for you. These may include:  Breathing exercises.  Routines to release muscle tension.  Visualizing peaceful scenes.  Cut back on alcohol, caffeinated beverages, and cigarettes, especially close to bedtime. These can disrupt your sleep.  Do not overeat or eat spicy foods right before bedtime. This can lead to digestive discomfort that can make it hard for you to sleep.  Limit screen use before bedtime. This includes:  Watching TV.  Using your smartphone, tablet, and computer.  Stick to a routine. This can help you fall asleep faster. Try to do a quiet activity, brush your teeth, and go to bed at the same time each night.  Get out of bed if you are still awake after 15 minutes of trying to sleep. Keep the lights down, but try reading or doing a quiet activity. When you feel sleepy, go back to bed.  Make sure that you drive carefully. Avoid driving if you feel very sleepy.  Keep all follow-up appointments as directed by your health care provider. This is important. Contact a health care provider if:  You are tired throughout the day or have trouble in your daily routine due to sleepiness.  You continue to have sleep problems or your sleep problems get worse. Get help right away if:  You have serious thoughts about hurting yourself or someone else. This information is not intended to replace advice given to you by your health care provider. Make sure you discuss any questions you have with your health care provider. Document Released: 09/29/2000 Document Revised: 03/03/2016 Document Reviewed: 07/03/2014 Elsevier Interactive Patient Education  2017 [employer].  IF you received an x-ray today, you will receive an invoice from [facility]. Please contact [facility] at  [phone] with questions or concerns regarding your invoice.   IF you received labwork today, you will receive an invoice from [facility]. Please contact [facility] at [phone] with questions or concerns regarding your invoice.   Our billing staff will not be able to assist you with questions regarding bills from these companies.  You will be contacted with the lab results as soon as they are available. The fastest way to get your results is to activate your My Chart account. Instructions are located on the last page of this paperwork. If you have not heard from [place] regarding the results in 2 weeks, please contact this office.

## 2016-12-15 NOTE — Progress Notes (Signed)
Tracie Gonzalez  MRN: [id number] DOB: July 07, 1966  PCP: Johny Blamer, MD  Subjective:  Pt is a 51 year old female who presents to clinic for difficulty sleeping. She was last here 2/22 for depression. She is taking Pristiq 100mg  qd.  She tosses and turns. "I don't know I just can't sleep". It's like I'm scared but I don't know why I'm scared.  She plans to see her therapist next Wednesday. She sees him q 2 weeks. She likes him, but it feels like "things just bounce off of him. She has a difficult time leaving her house - she did not go to work today.  "One minute I thought I was getting better, now I can't sleep."  At night before bed she cuddles with her dogs and watches Tv. TV is her downfall as far as staying up. Does not exercise.  She sleeps 1-2 hours, then wakes up with racing thoughts. The negative personalities at work are effecting her emotionally. She likes her job.  She has not tried acupuncture, stretching, meditation, hot tea. She does not like to read - however she will read the Bible.  Denies plan of suicide, plans of harm to self or others.   Review of Systems  Respiratory: Negative for cough, choking, chest tightness, shortness of breath and wheezing.   Cardiovascular: Negative for chest pain and palpitations.  Gastrointestinal: Negative for constipation, diarrhea, nausea and vomiting.  Musculoskeletal: Positive for back pain (chronic). Negative for arthralgias, myalgias and neck pain.  Psychiatric/Behavioral: Positive for dysphoric mood and sleep disturbance. Negative for hallucinations, self-injury and suicidal ideas. The patient is nervous/anxious.     Patient Active Problem List   Diagnosis Date Noted  . Chronic low back pain 03/28/2016  . Depression 03/28/2016  . Hashimoto's disease 08/21/2014  . Cervicogenic headache 10/30/2013    Current Outpatient Prescriptions on File Prior to Visit  Medication Sig Dispense Refill  . Acetaminophen-Pamabrom 500-25 MG  TABS Take 1,000 mg by mouth every 6 (six) hours as needed. Max dose 8 tabs/24hrs 30 each 0  . amphetamine-dextroamphetamine (ADDERALL) 30 MG tablet Take 1 tablet (30 mg total) by mouth 2 (two) times daily. Do not fill before 08/02/2012 60 tablet 0  . APPLE CIDER VINEGAR PO Take 2 oz by mouth daily. In water    . Ascorbic Acid (VITAMIN C) 1000 MG tablet Take 1,000 mg by mouth 3 (three) times daily.    Marland Kitchen buPROPion (WELLBUTRIN XL) 300 MG 24 hr tablet Take 1 tablet (300 mg total) by mouth daily. 90 tablet 3  . desvenlafaxine (PRISTIQ) 100 MG 24 hr tablet Take 1 tablet (100 mg total) by mouth daily. 30 tablet 0  . etodolac (LODINE) 400 MG tablet Take 400 mg by mouth.    . meloxicam (MOBIC) 15 MG tablet Take 1 tablet (15 mg total) by mouth daily. 30 tablet 0  . minocycline (DYNACIN) 100 MG tablet Take 100 mg by mouth as needed.    . Multiple Vitamin (MULTIVITAMIN) tablet Take 1 tablet by mouth daily.    . NON FORMULARY     . triamcinolone cream (KENALOG) 0.1 % Apply 1 application topically 2 (two) times daily. 30 g 1  . valACYclovir (VALTREX) 1000 MG tablet TAKE 2 TABLETS TWICE A DAY FOR ACUTE FLARE 40 tablet 0   No current facility-administered medications on file prior to visit.     No Known Allergies   Objective:  BP 112/74   Pulse 93   Temp 97.6 F (36.4  C) (Oral)   Resp 18   Ht 5' 2.5" (1.588 m)   Wt 193 lb 3.2 oz (87.6 kg)   SpO2 98%   BMI 34.77 kg/m   Physical Exam  Constitutional: She is oriented to person, place, and time and well-developed, well-nourished, and in no distress. No distress.  Cardiovascular: Normal rate, regular rhythm and normal heart sounds.   Pulmonary/Chest: Effort normal. No respiratory distress.  Neurological: She is alert and oriented to person, place, and time. GCS score is 15.  Skin: Skin is warm and dry.  Psychiatric: Memory, affect and judgment normal. She exhibits a depressed mood.  Vitals reviewed.   Assessment and Plan :  1. Sleep  disturbance 2. Moderate single current episode of major depressive disorder (HCC) - hydrOXYzine (ATARAX/VISTARIL) 50 MG tablet; Take 1 tablet (50 mg total) by mouth at bedtime as needed and may repeat dose one time if needed. Max dose 100mg   Dispense: 30 tablet; Refill: 1 - Encouraged pt to try Melatonin x 1 week. If this does not work, take Atarax. Encouraged good sleep hygiene: turn off television, exercise during the day, take a hot bath before bed, read, stretch/yoga/meditate, acupuncture. Keep appt with therapist next week. She plans to have medications managed there.   Marco CollieWhitney Jovonta Levit, PA-C  Primary Care at [facility] 12/15/2016 6:12 PM

## 2016-12-21 ENCOUNTER — Ambulatory Visit (INDEPENDENT_AMBULATORY_CARE_PROVIDER_SITE_OTHER): Payer: BLUE CROSS/BLUE SHIELD | Admitting: Physician Assistant

## 2016-12-21 VITALS — BP 118/80 | HR 102 | Temp 98.1°F | Resp 18 | Ht 62.5 in | Wt 197.0 lb

## 2016-12-21 DIAGNOSIS — G479 Sleep disorder, unspecified: Secondary | ICD-10-CM | POA: Diagnosis not present

## 2016-12-21 DIAGNOSIS — Z8739 Personal history of other diseases of the musculoskeletal system and connective tissue: Secondary | ICD-10-CM | POA: Diagnosis not present

## 2016-12-21 DIAGNOSIS — R252 Cramp and spasm: Secondary | ICD-10-CM | POA: Diagnosis not present

## 2016-12-21 DIAGNOSIS — F321 Major depressive disorder, single episode, moderate: Secondary | ICD-10-CM

## 2016-12-21 LAB — POCT URINALYSIS DIP (MANUAL ENTRY)
BILIRUBIN UA: NEGATIVE
BILIRUBIN UA: NEGATIVE
Glucose, UA: NEGATIVE
LEUKOCYTES UA: NEGATIVE
Nitrite, UA: NEGATIVE
PH UA: 6.5
Urobilinogen, UA: 0.2

## 2016-12-21 NOTE — Patient Instructions (Addendum)
Trazodone -- for sleep Please drink more water - esp now that you  Have been using so much cold medications.  Please change and try using mucinex (blue box) as this will not cause problems with your blood pressure.   IF you received an x-ray today, you will receive an invoice from [facility]. Please contact [facility] at [phone] with questions or concerns regarding your invoice.   IF you received labwork today, you will receive an invoice from [facility]. Please contact [facility] at [phone] with questions or concerns regarding your invoice.   Our billing staff will not be able to assist you with questions regarding bills from these companies.  You will be contacted with the lab results as soon as they are available. The fastest way to get your results is to activate your My Chart account. Instructions are located on the last page of this paperwork. If you have not heard from us regarding the results in 2 weeks, please contact this office.

## 2016-12-21 NOTE — Progress Notes (Signed)
Tracie HaleDorcas M Gonzalez  Gonzalez: [id number] DOB: 08/16/1966  PCP: Tracie Gonzalez  Chief Complaint  Patient presents with  . Leg Pain    Subjective:  Pt presents to clinic for leg pain that started yesterday - this feels like when she had really high CPK and rhabdomyolysis in 2014.  It is slightly better today than yesterday - but yesterday she could not walk without bad calf cramping and pain - it does not feel like she has sore muscles like to hard of a work out because she has tingling in the muscles also.  She has over the last week taken a lot of alka-seltzer cold medication and really has not been drinking much fluids. .  Pain is worse with walking but then stops with rest.  Pain is equal bilaterally.  No recent travel. No h/o blood clots She mainly stays in her apartment.  She had trouble yesterday walking her dogs because of the pain.  Dr Tracie Gonzalez - therapist - they would like her to see the psychiatrist at the office - she has hopelessness but she is not crying like she used to on the pristiq.  She is waiting for the referral.  She tried atarax for sleep and it made her to dry mouth - but she is not sleeping well at night - does not feel rested.  Review of Systems  Constitutional: Negative for chills and fever.  Musculoskeletal: Positive for gait problem (2nd to pain) and myalgias (calfs = bilaterally).    Patient Active Problem List   Diagnosis Date Noted  . Chronic low back pain 03/28/2016  . Depression 03/28/2016  . Hashimoto's disease 08/21/2014  . Cervicogenic headache 10/30/2013    Current Outpatient Prescriptions on File Prior to Visit  Medication Sig Dispense Refill  . Acetaminophen-Pamabrom 500-25 MG TABS Take 1,000 mg by mouth every 6 (six) hours as needed. Max dose 8 tabs/24hrs 30 each 0  . amphetamine-dextroamphetamine (ADDERALL) 30 MG tablet Take 1 tablet (30 mg total) by mouth 2 (two) times daily. Do not fill before 08/02/2012 60 tablet 0  . APPLE CIDER VINEGAR PO  Take 2 oz by mouth daily. In water    . Ascorbic Acid (VITAMIN C) 1000 MG tablet Take 1,000 mg by mouth 3 (three) times daily.    Marland Kitchen. buPROPion (WELLBUTRIN XL) 300 MG 24 hr tablet Take 1 tablet (300 mg total) by mouth daily. 90 tablet 3  . desvenlafaxine (PRISTIQ) 100 MG 24 hr tablet Take 1 tablet (100 mg total) by mouth daily. 30 tablet 0  . etodolac (LODINE) 400 MG tablet Take 400 mg by mouth.    . meloxicam (MOBIC) 15 MG tablet Take 1 tablet (15 mg total) by mouth daily. 30 tablet 0  . minocycline (DYNACIN) 100 MG tablet Take 100 mg by mouth as needed.    . Multiple Vitamin (MULTIVITAMIN) tablet Take 1 tablet by mouth daily.    . NON FORMULARY     . triamcinolone cream (KENALOG) 0.1 % Apply 1 application topically 2 (two) times daily. 30 g 1  . valACYclovir (VALTREX) 1000 MG tablet TAKE 2 TABLETS TWICE A DAY FOR ACUTE FLARE 40 tablet 0   No current facility-administered medications on file prior to visit.     No Known Allergies  Pt patients past, family and social history were reviewed and updated.   Objective:  BP 118/80   Pulse (!) 102   Temp 98.1 F (36.7 C) (Oral)   Resp 18   Ht  5' 2.5" (1.588 m)   Wt 197 lb (89.4 kg)   SpO2 98%   BMI 35.46 kg/m   Physical Exam  Constitutional: She is oriented to person, place, and time and well-developed, well-nourished, and in no distress.  HENT:  Head: Normocephalic and atraumatic.  Right Ear: Hearing and external ear normal.  Left Ear: Hearing and external ear normal.  Eyes: Conjunctivae are normal.  Neck: Normal range of motion.  Cardiovascular: Normal rate, regular rhythm and normal heart sounds.   No murmur heard. Pulmonary/Chest: Effort normal and breath sounds normal. She has no wheezes.  Musculoskeletal:       Right lower leg: She exhibits tenderness. She exhibits no swelling and no edema.       Left lower leg: She exhibits tenderness. She exhibits no swelling and no edema.  No palpable cords bilaterally.  Neg homan's sign.   No signs of compression syndrome.  Neurological: She is alert and oriented to person, place, and time. Gait normal.  Skin: Skin is warm and dry.  Psychiatric: Mood, memory, affect and judgment normal.  Vitals reviewed.  Results for orders placed or performed in visit on 12/21/16  POCT urinalysis dipstick  Result Value Ref Range   Color, UA yellow yellow   Clarity, UA clear clear   Glucose, UA negative negative   Bilirubin, UA negative negative   Ketones, POC UA negative negative   Spec Grav, UA >=1.030    Blood, UA trace-intact (A) negative   pH, UA 6.5    Protein Ur, POC =100 (A) negative   Urobilinogen, UA 0.2    Nitrite, UA Negative Negative   Leukocytes, UA Negative Negative    Assessment and Plan :  Leg cramps - Plan: CK - concern for possible repeat of rhabdomyolysis as this feels like what she had last time but not nearly as bad.  She has decreased her oral intake over the last week with her current cold symptoms and has been using cold preps with decongestant in them.  Currently her urine is not changed in color but she will monitor for this.  She will purposefully drink large amount of fluids while waiting on these lab results.  I am encouraged that she is better today.  History of rhabdomyolysis - Plan: CK  Sleep disturbance - d/w psychiatry at her appt.  Moderate single current episode of major depressive disorder (HCC) - plans to f/u with psychiatrist at her therapist office for continuation of medication.  Tracie Lennert PA-C  Primary Care at [facility] 12/21/2016 5:44 PM

## 2016-12-22 ENCOUNTER — Telehealth: Payer: Self-pay

## 2016-12-22 LAB — CK: CK TOTAL: 2712 U/L — AB (ref 24–173)

## 2016-12-22 NOTE — Telephone Encounter (Signed)
L/m labs show rabdo, needs to come in today for fluids before 4pm Call us back as soon as message is received

## 2016-12-23 ENCOUNTER — Ambulatory Visit (INDEPENDENT_AMBULATORY_CARE_PROVIDER_SITE_OTHER): Payer: BLUE CROSS/BLUE SHIELD | Admitting: Physician Assistant

## 2016-12-23 VITALS — BP 128/78 | HR 103 | Temp 98.1°F | Resp 16 | Ht 61.5 in | Wt 196.5 lb

## 2016-12-23 DIAGNOSIS — R748 Abnormal levels of other serum enzymes: Secondary | ICD-10-CM

## 2016-12-23 DIAGNOSIS — M6282 Rhabdomyolysis: Secondary | ICD-10-CM

## 2016-12-23 DIAGNOSIS — F329 Major depressive disorder, single episode, unspecified: Secondary | ICD-10-CM

## 2016-12-23 DIAGNOSIS — G479 Sleep disorder, unspecified: Secondary | ICD-10-CM

## 2016-12-23 DIAGNOSIS — F32A Depression, unspecified: Secondary | ICD-10-CM

## 2016-12-23 LAB — POCT URINALYSIS DIP (MANUAL ENTRY)
Bilirubin, UA: NEGATIVE
Blood, UA: NEGATIVE
Glucose, UA: NEGATIVE
Ketones, POC UA: NEGATIVE
Leukocytes, UA: NEGATIVE
Nitrite, UA: NEGATIVE
Protein Ur, POC: NEGATIVE
Spec Grav, UA: 1.01
Urobilinogen, UA: 0.2
pH, UA: 5.5

## 2016-12-23 MED ORDER — TRAZODONE HCL 50 MG PO TABS: 25.0000 mg | ORAL_TABLET | Freq: Every evening | ORAL | 1 refills | Status: DC | PRN

## 2016-12-23 NOTE — Telephone Encounter (Signed)
Pt seen by CitigroupWhitney McVey

## 2016-12-23 NOTE — Progress Notes (Signed)
Tracie Gonzalez  MRN: [id number] DOB: 11-24-1965  PCP: Johny Blamer, MD  Subjective:  Pt is a 51 year old female who presents to clinic for treatment of rhabdomyolysis. She was here on 3/08 for leg cramps, stating it felt similar to when she had rhabdomyolysis in 2014.  Lab work 3/08: Total CK 2,712.   Today her calf cramps are better, however she c/o muscle weakness. She went to the [employer] yesterday and hand to keep switching her shopping basket from hand to hand due to muscle weakness. This morning she feels "queezy", low back pain, muscle aches all over.  She has been taking allergy medications, Alka Seltzer at night. C/o congestion x a few months. She has not been staying well hydrated.   H/o depression - She has appt to see Dr Marcelle Overlie. She had appt next week however says he will be one vacation. She would like Trazodone for sleep. She was Rx Atarax 3/02 for sleep, but this made her mouth dry and did not help her sleep.   Review of Systems  Constitutional: Negative for chills, diaphoresis, fatigue and fever.  HENT: Negative for congestion, postnasal drip, rhinorrhea, sinus pressure, sneezing and sore throat.   Respiratory: Negative for cough, chest tightness, shortness of breath and wheezing.   Cardiovascular: Negative for chest pain and palpitations.  Gastrointestinal: Negative for abdominal pain, diarrhea, nausea and vomiting.  Musculoskeletal: Positive for back pain and myalgias.  Skin: Negative.   Neurological: Positive for weakness. Negative for light-headedness and headaches.    Patient Active Problem List   Diagnosis Date Noted  . Chronic low back pain 03/28/2016  . Depression 03/28/2016  . Hashimoto's disease 08/21/2014  . Cervicogenic headache 10/30/2013    Current Outpatient Prescriptions on File Prior to Visit  Medication Sig Dispense Refill  . Acetaminophen-Pamabrom 500-25 MG TABS Take 1,000 mg by mouth every 6 (six) hours as needed. Max dose 8 tabs/24hrs  30 each 0  . amphetamine-dextroamphetamine (ADDERALL) 30 MG tablet Take 1 tablet (30 mg total) by mouth 2 (two) times daily. Do not fill before 08/02/2012 60 tablet 0  . APPLE CIDER VINEGAR PO Take 2 oz by mouth daily. In water    . Ascorbic Acid (VITAMIN C) 1000 MG tablet Take 1,000 mg by mouth 3 (three) times daily.    Marland Kitchen buPROPion (WELLBUTRIN XL) 300 MG 24 hr tablet Take 1 tablet (300 mg total) by mouth daily. 90 tablet 3  . desvenlafaxine (PRISTIQ) 100 MG 24 hr tablet Take 1 tablet (100 mg total) by mouth daily. 30 tablet 0  . etodolac (LODINE) 400 MG tablet Take 400 mg by mouth.    . meloxicam (MOBIC) 15 MG tablet Take 1 tablet (15 mg total) by mouth daily. 30 tablet 0  . minocycline (DYNACIN) 100 MG tablet Take 100 mg by mouth as needed.    . Multiple Vitamin (MULTIVITAMIN) tablet Take 1 tablet by mouth daily.    . NON FORMULARY     . triamcinolone cream (KENALOG) 0.1 % Apply 1 application topically 2 (two) times daily. 30 g 1  . valACYclovir (VALTREX) 1000 MG tablet TAKE 2 TABLETS TWICE A DAY FOR ACUTE FLARE 40 tablet 0   No current facility-administered medications on file prior to visit.     No Known Allergies   Objective:  BP 128/78   Pulse (!) 103   Temp 98.1 F (36.7 C) (Oral)   Resp 16   Ht 5' 1.5" (1.562 m)   Wt 196  lb 8 oz (89.1 kg)   SpO2 96%   BMI 36.53 kg/m   Physical Exam  Constitutional: She is oriented to person, place, and time and well-developed, well-nourished, and in no distress. No distress.  Cardiovascular: Normal rate, regular rhythm and normal heart sounds.   Musculoskeletal:  No muscle tightness, calves not TTP. Neg for compression syndrome.   Neurological: She is alert and oriented to person, place, and time. GCS score is 15.  Skin: Skin is warm and dry.  Psychiatric: Mood, memory, affect and judgment normal.  Vitals reviewed.   Assessment and Plan :  1. Non-traumatic rhabdomyolysis 2. Elevated CK - CK - Basic Metabolic Panel - PR NORMAL  SALINE SOLUTION INFUS - PR CATH IMPL VASC ACCESS PORTAL - POCT urinalysis dipstick - Pt received 2 L Ns. She reports feeling much better after fluids - "a little stiff, but not sore". UA improved since last Ov.  - Pt is to go home and stay well hydrated. Will write a note for 8 hour work day, instead of 10. She is to f/u with me in 48-72 hours. Will recheck CK and BMP.  3. Depression, unspecified depression type 4. Difficulty sleeping - traZODone (DESYREL) 50 MG tablet; Take 0.5-1 tablets (25-50 mg total) by mouth at bedtime as needed for sleep.  Dispense: 30 tablet; Refill: 1   Whitney Sacora Hawbaker, PA-C  [facility] at [facility] 12/23/2016 8:57 AM

## 2016-12-23 NOTE — Patient Instructions (Addendum)
Please be sure to drink plenty of fluids.  Please schedule a follow-up appointment with me on Tuesday. We will recheck your labs at that time to make sure you are improving.   Thank you for coming in today. I hope you feel we met your needs.  Feel free to call [facility] if you have any questions or further requests.  Please consider signing up for MyChart if you do not already have it, as this is a great way to communicate with me.  Best,  Whitney McVey, PA-C   IF you received an x-ray today, you will receive an invoice from [facility]. Please contact [facility] at [phone] with questions or concerns regarding your invoice.   IF you received labwork today, you will receive an invoice from [place]. Please contact [facility] at [phone] with questions or concerns regarding your invoice.   Our billing staff will not be able to assist you with questions regarding bills from these companies.  You will be contacted with the lab results as soon as they are available. The fastest way to get your results is to activate your My Chart account. Instructions are located on the last page of this paperwork. If you have not heard from [place] regarding the results in 2 weeks, please contact this office.    We recommend that you schedule a mammogram for breast cancer screening. Typically, you do not need a referral to do this. Please contact a local imaging center to schedule your mammogram.  [facility] - [phone]  *ask for the Radiology Department [facility] - [phone] or [phone]  [facility] - [phone] [place] [phone] [facility] - [phone]  *ask for the [facility] - [phone]  *ask for the Radiology Department [facility] - [phone]  *ask for the [place] - [phone]

## 2016-12-24 LAB — SPECIMEN STATUS

## 2016-12-25 LAB — CK: Total CK: 5309 U/L (ref 24–173)

## 2016-12-25 LAB — BASIC METABOLIC PANEL WITH GFR
CO2: 25 mmol/L (ref 18–29)
Glucose: 96 mg/dL (ref 65–99)
Potassium: 4.6 mmol/L (ref 3.5–5.2)
Sodium: 139 mmol/L (ref 134–144)

## 2016-12-25 LAB — BASIC METABOLIC PANEL
BUN/Creatinine Ratio: 27 — ABNORMAL HIGH (ref 9–23)
BUN: 35 mg/dL — ABNORMAL HIGH (ref 6–24)
Calcium: 9.6 mg/dL (ref 8.7–10.2)
Chloride: 96 mmol/L (ref 96–106)
Creatinine, Ser: 1.3 mg/dL — ABNORMAL HIGH (ref 0.57–1.00)
GFR calc Af Amer: 55 mL/min/{1.73_m2} — ABNORMAL LOW (ref >59)
GFR calc non Af Amer: 47 mL/min/{1.73_m2} — ABNORMAL LOW (ref >59)

## 2016-12-26 ENCOUNTER — Ambulatory Visit: Payer: BLUE CROSS/BLUE SHIELD

## 2016-12-26 NOTE — Progress Notes (Signed)
Please call pt and f/u. She was supposed to come in today and see me for repeat lab work.

## 2016-12-27 ENCOUNTER — Ambulatory Visit: Payer: BLUE CROSS/BLUE SHIELD

## 2016-12-27 ENCOUNTER — Telehealth: Payer: Self-pay | Admitting: Physician Assistant

## 2016-12-28 ENCOUNTER — Other Ambulatory Visit: Payer: Self-pay | Admitting: Physician Assistant

## 2016-12-28 DIAGNOSIS — F321 Major depressive disorder, single episode, moderate: Secondary | ICD-10-CM

## 2016-12-29 NOTE — Telephone Encounter (Signed)
LM for patient. Needs office visit and labs. I see that the patient has already been contacted by clerical staff vis MyChart for same.

## 2017-01-03 ENCOUNTER — Ambulatory Visit (INDEPENDENT_AMBULATORY_CARE_PROVIDER_SITE_OTHER): Payer: BLUE CROSS/BLUE SHIELD | Admitting: Physician Assistant

## 2017-01-03 VITALS — BP 110/60 | HR 71 | Temp 98.1°F | Resp 17 | Ht 61.5 in | Wt 195.0 lb

## 2017-01-03 DIAGNOSIS — R748 Abnormal levels of other serum enzymes: Secondary | ICD-10-CM

## 2017-01-03 DIAGNOSIS — M6282 Rhabdomyolysis: Secondary | ICD-10-CM | POA: Diagnosis not present

## 2017-01-03 LAB — POCT URINALYSIS DIP (MANUAL ENTRY)
Bilirubin, UA: NEGATIVE
Blood, UA: NEGATIVE
Glucose, UA: NEGATIVE
Ketones, POC UA: NEGATIVE
Leukocytes, UA: NEGATIVE
Nitrite, UA: NEGATIVE
Protein Ur, POC: NEGATIVE
Spec Grav, UA: 1.02 (ref 1.030–1.035)
Urobilinogen, UA: 0.2 (ref ?–2.0)
pH, UA: 6.5 (ref 5.0–8.0)

## 2017-01-03 LAB — BASIC METABOLIC PANEL
BUN: 23 mg/dL (ref 7–25)
CO2: 27 mmol/L (ref 20–31)
Chloride: 102 mmol/L (ref 98–110)
Creat: 0.89 mg/dL (ref 0.50–1.05)

## 2017-01-03 LAB — BASIC METABOLIC PANEL WITH GFR
Calcium: 9.5 mg/dL (ref 8.6–10.4)
Glucose, Bld: 83 mg/dL (ref 65–99)
Potassium: 4.1 mmol/L (ref 3.5–5.3)
Sodium: 141 mmol/L (ref 135–146)

## 2017-01-03 NOTE — Progress Notes (Signed)
Tracie Gonzalez  MRN: [id number] DOB: 30-Jun-1966  PCP: Johny BlamerHARRIS, WILLIAM, MD  Subjective:  Pt is a 51 year old female who presents to clinic for follow-up lab work.  Lab work: 3/08 CK 2,712 3/10 CK 5,309  She was here on 3/8 for leg cramps and was treated for rhabdomyolysis.She received IV fluids. OV on 3/10, labs were redrawn. Her CK continued to rise and she was asked to follow-up immediately, however she missed her appt.   H/o rhabdomyolysis - Sensation of "pins and needles" as well as calf cramping has resolved. Today c/o occasional soreness in shoulders and feels weak walking up steps.  She states this year she has suffered from more muscle soreness than she ever has in the past. 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 She has had rhabdomyolysis before in 2014. She has a history of elevated CK levels dating from 2014.  Denies eye pain, headache.  She needs short-term disability forms filled out today.   Review of Systems  Musculoskeletal: Positive for arthralgias, back pain and myalgias. Negative for gait problem, joint swelling, neck pain and neck stiffness.  Neurological: Negative for weakness, numbness and headaches.    Patient Active Problem List   Diagnosis Date Noted  . Chronic low back pain 03/28/2016  . Depression 03/28/2016  . Hashimoto's disease 08/21/2014  . Cervicogenic headache 10/30/2013    Current Outpatient Prescriptions on File Prior to Visit  Medication Sig Dispense Refill  . amphetamine-dextroamphetamine (ADDERALL) 30 MG tablet Take 1 tablet (30 mg total) by mouth 2 (two) times daily. Do not fill before 08/02/2012 60 tablet 0  . APPLE CIDER VINEGAR PO Take 2 oz by mouth daily. In water    . Ascorbic Acid (VITAMIN C) 1000 MG tablet Take 1,000 mg by mouth 3 (three) times daily.    Marland Kitchen. buPROPion (WELLBUTRIN XL) 300 MG 24 hr tablet Take 1 tablet (300 mg total) by mouth daily. 90 tablet 3  . desvenlafaxine (PRISTIQ) 100 MG 24 hr tablet Take 1  tablet (100 mg total) by mouth daily. 30 tablet 0  . etodolac (LODINE) 400 MG tablet Take 400 mg by mouth.    . minocycline (DYNACIN) 100 MG tablet Take 100 mg by mouth as needed.    . Multiple Vitamin (MULTIVITAMIN) tablet Take 1 tablet by mouth daily.    . NON FORMULARY     . traZODone (DESYREL) 50 MG tablet Take 0.5-1 tablets (25-50 mg total) by mouth at bedtime as needed for sleep. 30 tablet 1  . triamcinolone cream (KENALOG) 0.1 % Apply 1 application topically 2 (two) times daily. 30 g 1  . valACYclovir (VALTREX) 1000 MG tablet TAKE 2 TABLETS TWICE A DAY FOR ACUTE FLARE 40 tablet 0  . Acetaminophen-Pamabrom 500-25 MG TABS Take 1,000 mg by mouth every 6 (six) hours as needed. Max dose 8 tabs/24hrs (Patient not taking: Reported on 01/03/2017) 30 each 0   No current facility-administered medications on file prior to visit.     No Known Allergies   Objective:  BP 110/60 (BP Location: Right Arm, Patient Position: Sitting, Cuff Size: Normal)   Pulse 71   Temp 98.1 F (36.7 C) (Oral)   Resp 17   Ht 5' 1.5" (1.562 m)   Wt 195 lb (88.5 kg)   SpO2 98%   BMI 36.25 kg/m   Physical Exam  Constitutional: She is oriented to person, place, and time and well-developed, well-nourished, and in no distress. No distress.  Cardiovascular: Normal rate, regular rhythm and normal heart sounds.   Musculoskeletal:       Right shoulder: She exhibits normal range of motion, no tenderness, no bony tenderness and normal strength.       Left shoulder: She exhibits normal range of motion and normal strength.  Neurological: She is alert and oriented to person, place, and time. GCS score is 15.  Skin: Skin is warm and dry.  Psychiatric: Mood, memory, affect and judgment normal.  Vitals reviewed.  Results for orders placed or performed in visit on 01/03/17  POCT urinalysis dipstick  Result Value Ref Range   Color, UA yellow yellow   Clarity, UA clear clear   Glucose, UA negative negative   Bilirubin, UA  negative negative   Ketones, POC UA negative negative   Spec Grav, UA 1.020 1.030 - 1.035   Blood, UA negative negative   pH, UA 6.5 5.0 - 8.0   Protein Ur, POC negative negative   Urobilinogen, UA 0.2 Negative - 2.0   Nitrite, UA Negative Negative   Leukocytes, UA Negative Negative    Assessment and Plan :  1. Non-traumatic rhabdomyolysis 2. Elevated CK - CK - Basic Metabolic Panel - POCT urinalysis dipstick - Ambulatory referral to Rheumatology - Pt presents today to have short-term disability forms filled out for recent absence from work due to depression and rhabdomyolysis. She was asked to f/u following a CK level >5,000, however she never did. Her symptoms have improved over the past week, will not administer IV fluids today. Labs are pending.  - Amb referral to rheumatology. Concern for possible polymyositis - she has had several elevated CK levels over the past few years.     Marco Collie, PA-C  Primary Care at [facility] 01/03/2017 4:27 PM

## 2017-01-03 NOTE — Patient Instructions (Addendum)
We recommend that you schedule a mammogram for breast cancer screening. Typically, you do not need a referral to do this. Please contact a local imaging center to schedule your mammogram.  [facility] - [phone]  *ask for the Radiology Department [facility] - [phone] or [phone]  [facility] - [phone] [facility] - [phone] [facility] - [phone]  *ask for the Radiology Department [facility] - [phone]  *ask for the Radiology Department [facility] - [phone]  *ask for the Mammography Department [facility] - [phone]     IF you received an x-ray today, you will receive an invoice from [facility]. Please contact [facility] at [phone] with questions or concerns regarding your invoice.   IF you received labwork today, you will receive an invoice from [facility]. Please contact [facility] at [phone] with questions or concerns regarding your invoice.   Our billing staff will not be able to assist you with questions regarding bills from these companies.  You will be contacted with the lab results as soon as they are available. The fastest way to get your results is to activate your My Chart account. Instructions are located on the last page of this paperwork. If you have not heard from us regarding the results in 2 weeks, please contact this office.      

## 2017-01-04 LAB — CK: Total CK: 166 U/L (ref 7–177)

## 2017-01-05 ENCOUNTER — Ambulatory Visit (INDEPENDENT_AMBULATORY_CARE_PROVIDER_SITE_OTHER): Payer: BLUE CROSS/BLUE SHIELD | Admitting: Physician Assistant

## 2017-01-05 VITALS — BP 118/76 | HR 80 | Temp 98.3°F | Resp 16 | Ht 62.0 in | Wt 196.0 lb

## 2017-01-05 DIAGNOSIS — Z8739 Personal history of other diseases of the musculoskeletal system and connective tissue: Secondary | ICD-10-CM | POA: Diagnosis not present

## 2017-01-05 DIAGNOSIS — M791 Myalgia, unspecified site: Secondary | ICD-10-CM

## 2017-01-05 NOTE — Progress Notes (Signed)
Tracie Gonzalez  MRN: [id number] DOB: 11-05-1965  PCP: Johny Blamer, MD  Subjective:  Pt is a 51 year old woman who presents to clinic for soreness and feeling tired.   This is a chronic problem for this pt - worsening symptoms over the past year. She was recently treated for rhabdomyolysis. Her CK levels are back to baseline: 166. CK 3/10: 5,309  Today she c/o muscle soreness. She went to bed at 7 last night and slept till 11am and did not go to work today. She endorses muscle fatigue and soreness - mainly in hips, shoulder and low back. 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  Denies urinary frequency, urgency, burning, muscle weakness, numbness, tingling.   She is drinking #2 8-oz bottles of water/day. She mixes in electrolytes in her water.  Her diet consists of corn dogs, peanuts, cashews and M&Ms.  She has a green drink and red drink - "one has all your greens and the other has all your fruits". She is supposed to drink this every day - has not has this for a few weeks. She heard about these from a Dr. On TV.  She does not exercise.   Review of Systems  Musculoskeletal: Positive for back pain and myalgias.    Patient Active Problem List   Diagnosis Date Noted  . Chronic low back pain 03/28/2016  . Depression 03/28/2016  . Hashimoto's disease 08/21/2014  . Cervicogenic headache 10/30/2013    Current Outpatient Prescriptions on File Prior to Visit  Medication Sig Dispense Refill  . Acetaminophen-Pamabrom 500-25 MG TABS Take 1,000 mg by mouth every 6 (six) hours as needed. Max dose 8 tabs/24hrs 30 each 0  . amphetamine-dextroamphetamine (ADDERALL) 30 MG tablet Take 1 tablet (30 mg total) by mouth 2 (two) times daily. Do not fill before 08/02/2012 60 tablet 0  . APPLE CIDER VINEGAR PO Take 2 oz by mouth daily. In water    . Ascorbic Acid (VITAMIN C) 1000 MG tablet Take 1,000 mg by mouth 3 (three) times daily.    Marland Kitchen buPROPion  (WELLBUTRIN XL) 300 MG 24 hr tablet Take 1 tablet (300 mg total) by mouth daily. 90 tablet 3  . desvenlafaxine (PRISTIQ) 100 MG 24 hr tablet Take 1 tablet (100 mg total) by mouth daily. 30 tablet 0  . etodolac (LODINE) 400 MG tablet Take 400 mg by mouth.    . minocycline (DYNACIN) 100 MG tablet Take 100 mg by mouth as needed.    . Multiple Vitamin (MULTIVITAMIN) tablet Take 1 tablet by mouth daily.    . NON FORMULARY     . traZODone (DESYREL) 50 MG tablet Take 0.5-1 tablets (25-50 mg total) by mouth at bedtime as needed for sleep. 30 tablet 1  . valACYclovir (VALTREX) 1000 MG tablet TAKE 2 TABLETS TWICE A DAY FOR ACUTE FLARE 40 tablet 0  . triamcinolone cream (KENALOG) 0.1 % Apply 1 application topically 2 (two) times daily. (Patient not taking: Reported on 01/05/2017) 30 g 1   No current facility-administered medications on file prior to visit.     No Known Allergies   Objective:  BP 118/76   Pulse 80   Temp 98.3 F (36.8 C) (Oral)   Resp 16   Ht 5\' 2"  (1.575 m)   Wt 196 lb (88.9 kg)   SpO2 97%   BMI 35.85 kg/m   Physical Exam  Constitutional: She is oriented to person, place, and time and  well-developed, well-nourished, and in no distress. No distress.  Cardiovascular: Normal rate, regular rhythm and normal heart sounds.   Musculoskeletal: Normal range of motion. She exhibits no edema, tenderness or deformity.  Neurological: She is alert and oriented to person, place, and time. GCS score is 15.  Skin: Skin is warm and dry.  Psychiatric: Mood, memory, affect and judgment normal.  Vitals reviewed.   Assessment and Plan :  1. History of rhabdomyolysis 2. Muscle soreness - Following lab work 3/10, pt was called multiple times to RTC for CK level >5,000. She did not come in until 3/21 when she needed short-term disability paperwork filled out.  - Pt was not asked to f/u today, although she is here for muscle soreness "because of my rhabdo". CK level on 3/21 shows normalization.  She is asking for a work note as she did not go in today due to soreness. She is almost "out of points" at work for missing days - after that she gets fired. She has been fired once already. She asked for work note "for my rhabdomyolysis", which is covered by her FMLA, however I explained to her that this condition has resolved. After this she requested a work note due to depression, which is also covered by her FMLA. However this was not the c/c she came in for today. Work note written today stating rhabdomyolysis resolved and pt may return to work.  - Referral made 3/21 to rheumatology for work-up of possible polymyositis - they have not called her yet. She has appt with her therapist for her depression later this week.   Marco CollieWhitney Aunna Snooks, PA-C  Primary Care at [facility] 01/05/2017 5:56 PM

## 2017-01-05 NOTE — Patient Instructions (Addendum)
[facility] in [place] Phone [phone]   Exercising to Lose Weight Exercising can help you to lose weight. In order to lose weight through exercise, you need to do vigorous-intensity exercise. You can tell that you are exercising with vigorous intensity if you are breathing very hard and fast and cannot hold a conversation while exercising. Moderate-intensity exercise helps to maintain your current weight. You can tell that you are exercising at a moderate level if you have a higher heart rate and faster breathing, but you are still able to hold a conversation. How often should I exercise? Choose an activity that you enjoy and set realistic goals. Your health care provider can help you to make an activity plan that works for you. Exercise regularly as directed by your health care provider. This may include:  Doing resistance training twice each week, such as:  Push-ups.  Sit-ups.  Lifting weights.  Using resistance bands.  Doing a given intensity of exercise for a given amount of time. Choose from these options:  150 minutes of moderate-intensity exercise every week.  75 minutes of vigorous-intensity exercise every week.  A mix of moderate-intensity and vigorous-intensity exercise every week. Children, pregnant women, people who are out of shape, people who are overweight, and older adults may need to consult a health care provider for individual recommendations. If you have any sort of medical condition, be sure to consult your health care provider before starting a new exercise program. What are some activities that can help me to lose weight?  Walking at a rate of at least 4.5 miles an hour.  Jogging or running at a rate of 5 miles per hour.  Biking at a rate of at least 10 miles per hour.  Lap swimming.  Roller-skating or in-line skating.  Cross-country skiing.  Vigorous competitive sports, such as football, basketball, and soccer.  Jumping  rope.  Aerobic dancing. How can I be more active in my day-to-day activities?  Use the stairs instead of the elevator.  Take a walk during your lunch break.  If you drive, park your car farther away from work or school.  If you take public transportation, get off one stop early and walk the rest of the way.  Make all of your phone calls while standing up and walking around.  Get up, stretch, and walk around every 30 minutes throughout the day. What guidelines should I follow while exercising?  Do not exercise so much that you hurt yourself, feel dizzy, or get very short of breath.  Consult your health care provider prior to starting a new exercise program.  Wear comfortable clothes and shoes with good support.  Drink plenty of water while you exercise to prevent dehydration or heat stroke. Body water is lost during exercise and must be replaced.  Work out until you breathe faster and your heart beats faster. This information is not intended to replace advice given to you by your health care provider. Make sure you discuss any questions you have with your health care provider. Document Released: 11/04/2010 Document Revised: 03/09/2016 Document Reviewed: 03/05/2014 Elsevier Interactive Patient Education  2017 [employer].   IF you received an x-ray today, you will receive an invoice from [facility]. Please contact [facility] at [phone] with questions or concerns regarding your invoice.   IF you received labwork today, you will receive an invoice from [place]. Please contact [facility] at [phone] with questions or concerns regarding your invoice.   Our billing staff will  not be able to assist you with questions regarding bills from these companies.  You will be contacted with the lab results as soon as they are available. The fastest way to get your results is to activate your My Chart account. Instructions are located on the last page of this  paperwork. If you have not heard from us regarding the results in 2 weeks, please contact this office.

## 2017-01-17 ENCOUNTER — Ambulatory Visit: Payer: BLUE CROSS/BLUE SHIELD

## 2017-01-23 ENCOUNTER — Emergency Department (HOSPITAL_COMMUNITY)
Admission: EM | Admit: 2017-01-23 | Discharge: 2017-01-23 | Disposition: A | Discharge status: Home / Self Care | Payer: BLUE CROSS/BLUE SHIELD | Attending: Emergency Medicine | Admitting: Emergency Medicine

## 2017-01-23 ENCOUNTER — Emergency Department (HOSPITAL_COMMUNITY): Payer: BLUE CROSS/BLUE SHIELD

## 2017-01-23 ENCOUNTER — Encounter (HOSPITAL_COMMUNITY): Payer: Self-pay | Admitting: Emergency Medicine

## 2017-01-23 DIAGNOSIS — R079 Chest pain, unspecified: Secondary | ICD-10-CM | POA: Diagnosis present

## 2017-01-23 DIAGNOSIS — R0789 Other chest pain: Secondary | ICD-10-CM

## 2017-01-23 LAB — CBC
HCT: 38.8 % (ref 36.0–46.0)
HEMOGLOBIN: 12.7 g/dL (ref 12.0–15.0)
MCH: 28.5 pg (ref 26.0–34.0)
MCHC: 32.7 g/dL (ref 30.0–36.0)
MCV: 87.2 fL (ref 78.0–100.0)
PLATELETS: 282 10*3/uL (ref 150–400)
RBC: 4.45 MIL/uL (ref 3.87–5.11)
RDW: 13.4 % (ref 11.5–15.5)
WBC: 10.1 10*3/uL (ref 4.0–10.5)

## 2017-01-23 LAB — BASIC METABOLIC PANEL
ANION GAP: 7 (ref 5–15)
BUN: 12 mg/dL (ref 6–20)
CALCIUM: 8.7 mg/dL — AB (ref 8.9–10.3)
CHLORIDE: 104 mmol/L (ref 101–111)
CO2: 24 mmol/L (ref 22–32)
CREATININE: 0.74 mg/dL (ref 0.44–1.00)
GFR calc non Af Amer: >60 mL/min (ref >60)
Glucose, Bld: 115 mg/dL — ABNORMAL HIGH (ref 65–99)
Potassium: 3.9 mmol/L (ref 3.5–5.1)
SODIUM: 135 mmol/L (ref 135–145)

## 2017-01-23 LAB — I-STAT TROPONIN, ED: TROPONIN I, POC: 0 ng/mL (ref 0.00–0.08)

## 2017-01-23 MED ORDER — NAPROXEN 375 MG PO TABS: 375.0000 mg | ORAL_TABLET | Freq: Two times a day (BID) | ORAL | 0 refills | Status: DC

## 2017-01-23 MED ORDER — IOPAMIDOL (ISOVUE-370) INJECTION 76%
INTRAVENOUS | Status: AC
  Administered 2017-01-23: 93 mL via INTRAVENOUS
  Filled 2017-01-23: qty 100

## 2017-01-23 MED ORDER — KETOROLAC TROMETHAMINE 15 MG/ML IJ SOLN
15.0000 mg | Freq: Once | INTRAMUSCULAR | Status: AC
  Administered 2017-01-23: 15 mg via INTRAVENOUS
  Filled 2017-01-23: qty 1

## 2017-01-23 NOTE — ED Provider Notes (Signed)
[facility]-EMERGENCY DEPT Provider Note   CSN: [id number] Arrival date & time: 01/23/17  1831     History   Chief Complaint Chief Complaint  Patient presents with  . Chest Pain    HPI Tracie Gonzalez is a 51 y.o. female.  Patient is a 51 year old female with a history of thyroid disease and depression presenting today with left-sided chest pain. Patient does not smoke or use drugs. She states this pain started spontaneously today without exertion. It does seem to be worse with taking a deep breath, bending over, getting into a car and moving her arms. She has not had cough, fever, shortness of breath or congestion. No abdominal pain nausea or vomiting. No recent history of travel, unilateral leg pain or swelling, estrogen replacement for family history of blood cough. She has no family history of cardiac disease and she herself has no history of cardiac issues. She states for the last 1-2 years she's had intermittent discomfort in the same area and the left portion of her chest but today is the first a she's ever had true pain has not improved. She initially had some mild numbness in the left arm this morning but that has resolved. She has no shoulder pain were weakness to the arm.   The history is provided by the patient.    Past Medical History:  Diagnosis Date  . Depression   . Thyroid disease     Patient Active Problem List   Diagnosis Date Noted  . Chronic low back pain 03/28/2016  . Depression 03/28/2016  . Hashimoto's disease 08/21/2014  . Cervicogenic headache 10/30/2013    Past Surgical History:  Procedure Laterality Date  . ABDOMINAL HYSTERECTOMY      OB History    No data available       Home Medications    Prior to Admission medications   Medication Sig Start Date End Date Taking? Authorizing Provider  amphetamine-dextroamphetamine (ADDERALL) 30 MG tablet Take 1 tablet (30 mg total) by mouth 2 (two) times daily. Do not fill before 08/02/2012 03/22/13  Yes Elvina Sidle, MD  Ascorbic Acid (VITAMIN C) 1000 MG tablet Take 1,000 mg by mouth 3 (three) times daily.   Yes Historical Provider, MD  buPROPion (WELLBUTRIN XL) 300 MG 24 hr tablet Take 1 tablet (300 mg total) by mouth daily. 02/15/16  Yes Elvina Sidle, MD  desvenlafaxine (PRISTIQ) 100 MG 24 hr tablet Take 1 tablet (100 mg total) by mouth daily. 12/11/16  Yes Morrell Riddle, PA-C  etodolac (LODINE) 400 MG tablet Take 400 mg by mouth 2 (two) times daily as needed for mild pain.    Yes Historical Provider, MD  minocycline (DYNACIN) 100 MG tablet Take 100 mg by mouth daily as needed (for face breakout).    Yes Historical Provider, MD  Multiple Vitamin (MULTIVITAMIN WITH MINERALS) TABS tablet Take 1 tablet by mouth daily.   Yes Historical Provider, MD  traZODone (DESYREL) 50 MG tablet Take 0.5-1 tablets (25-50 mg total) by mouth at bedtime as needed for sleep. 12/23/16  Yes Elizabeth Nathaneil Feagans McVey, PA-C  valACYclovir (VALTREX) 1000 MG tablet TAKE 2 TABLETS TWICE A DAY FOR ACUTE FLARE Patient taking differently: Take 2 tablets by mouth twice daily as needed for outbreak 10/29/16  Yes Morrell Riddle, PA-C    Family History Family History  Problem Relation Age of Onset  . Cancer Mother     Deceased, 5  . Cancer Father     Deceased, 72  .  Hyperlipidemia Brother     Social History Social History  Substance Use Topics  . Smoking status: Never Smoker  . Smokeless tobacco: Never Used  . Alcohol use No     Comment: socially     Allergies   Patient has no known allergies.   Review of Systems Review of Systems  All other systems reviewed and are negative.    Physical Exam Updated Vital Signs BP 121/79 (BP Location: Left Arm)   Pulse 65   Temp 98 F (36.7 C) (Oral)   Resp 18   Ht  (1.575 m)   Wt 192 lb 7 oz (87.3 kg)   SpO2 98%   BMI 35.20 kg/m   Physical Exam  Constitutional: She is oriented to person, place, and time. She appears well-developed and well-nourished. No  distress.  HENT:  Head: Normocephalic and atraumatic.  Mouth/Throat: Oropharynx is clear and moist.  Eyes: Conjunctivae and EOM are normal. Pupils are equal, round, and reactive to light.  Neck: Normal range of motion. Neck supple.  Cardiovascular: Normal rate, regular rhythm and intact distal pulses.   No murmur heard. Pulses are equal in bilateral upper extremities  Pulmonary/Chest: Effort normal and breath sounds normal. No respiratory distress. She has no wheezes. She has no rales. She exhibits no tenderness.  No reproducible chest pain  Abdominal: Soft. She exhibits no distension. There is no tenderness. There is no rebound and no guarding.  Musculoskeletal: Normal range of motion. She exhibits no edema or tenderness.  Neurological: She is alert and oriented to person, place, and time.  Skin: Skin is warm and dry. No rash noted. No erythema.  Psychiatric: She has a normal mood and affect. Her behavior is normal.  Nursing note and vitals reviewed.    ED Treatments / Results  Labs (all labs ordered are listed, but only abnormal results are displayed) Labs Reviewed  BASIC METABOLIC PANEL - Abnormal; Notable for the following:       Result Value   Glucose, Bld 115 (*)    Calcium 8.7 (*)    All other components within normal limits  CBC  I-STAT TROPOININ, ED    EKG  EKG Interpretation  Date/Time:  Tuesday January 23 2017 18:54:07 EDT Ventricular Rate:  69 PR Interval:    QRS Duration: 99 QT Interval:  415 QTC Calculation: 445 R Axis:   126 Text Interpretation:  Right and left arm electrode reversal, interpretation assumes no reversal Sinus or ectopic atrial rhythm Probable lateral infarct, age indeterminate Baseline wander in lead(s) III aVF No previous tracing Confirmed by Anitra Lauth  MD, Adaleena Mooers ([id number]) on 01/23/2017 8:18:23 PM       Radiology Dg Chest 2 View  Result Date: 01/23/2017 CLINICAL DATA:  Central chest pain worsening today. EXAM: CHEST  2 VIEW COMPARISON:   Chest radiograph November 29, 2012 FINDINGS: Cardiomediastinal silhouette is normal. No pleural effusions or focal consolidations. Linear densities with midlung zone. Trachea projects midline and there is no pneumothorax. Soft tissue planes and included osseous structures are non-suspicious. IMPRESSION: LEFT midlung zone atelectasis or scarring. Electronically Signed   By: Awilda Metro M.D.   On: 01/23/2017 19:25   Ct Angio Chest Pe W Or Wo Contrast  Result Date: 01/23/2017 CLINICAL DATA:  Acute onset of central chest pain. Initial encounter. EXAM: CT ANGIOGRAPHY CHEST WITH CONTRAST TECHNIQUE: Multidetector CT imaging of the chest was performed using the standard protocol during bolus administration of intravenous contrast. Multiplanar CT image reconstructions and MIPs were obtained  to evaluate the vascular anatomy. CONTRAST:  93 mL of Isovue 370 IV contrast COMPARISON:  Chest radiograph performed earlier today at 7:18 p.m. FINDINGS: Cardiovascular:  There is no evidence of pulmonary embolus. The heart is unremarkable in appearance. The thoracic aorta is within normal limits. The great vessels are unremarkable. No calcific atherosclerotic disease is seen. Mediastinum/Nodes: The mediastinum is unremarkable in appearance. No mediastinal lymphadenopathy is seen. No pericardial effusion is identified. The thyroid gland is unremarkable. No axillary lymphadenopathy is appreciated. Lungs/Pleura: Mild bibasilar atelectasis is noted. No pleural effusion or pneumothorax is seen. No masses are identified. Upper Abdomen: The visualized portions of the liver and spleen are grossly unremarkable. The visualized portions of the pancreas, adrenal glands and kidneys are within normal limits. Musculoskeletal: No acute osseous abnormalities are identified. The visualized musculature is unremarkable in appearance. Review of the MIP images confirms the above findings. IMPRESSION: 1. No evidence of pulmonary embolus. 2. Mild  bibasilar atelectasis noted.  Lungs otherwise clear. Electronically Signed   By: Roanna Raider M.D.   On: 01/23/2017 21:50    Procedures Procedures (including critical care time)  Medications Ordered in ED Medications  ketorolac (TORADOL) 15 MG/ML injection 15 mg (not administered)  iopamidol (ISOVUE-370) 76 % injection (not administered)     Initial Impression / Assessment and Plan / ED Course  I have reviewed the triage vital signs and the nursing notes.  Pertinent labs & imaging results that were available during my care of the patient were reviewed by me and considered in my medical decision making (see chart for details).     Patient is a 51 year old female with a pleuritic type chest pain in her left chest today that has not improved. Could be musculoskeletal in nature versus pleurisy. Patient has had no URI symptoms. Low suspicion that patient's symptoms are related to ACS or dissection. Patient will vital signs. No evidence of hypoxia.  Symptoms do not seem to be related to GI issues. She was low risk for PE and is a nonsmoker and does not use drugs. However on x-ray patient has middle lung zone atelectasis or scarring as well as linear densities. This is exactly she's had intermittent discomfort for the last year and also the location of her pain today. Will do a CT for further evaluation to ensure no mass or PE. Skin manifestation concerning for zoster. Patient given Toradol for pain control. CT pending.  EKG shows arm lead reversal but otherwise no acute findings. No evidence of pericarditis.  12:07 AM CT neg and pt treated for msk pain Final Clinical Impressions(s) / ED Diagnoses   Final diagnoses:  Chest wall pain    New Prescriptions Discharge Medication List as of 01/23/2017 10:12 PM    START taking these medications   Details  naproxen (NAPROSYN) 375 MG tablet Take 1 tablet (375 mg total) by mouth 2 (two) times daily., Starting Tue 01/23/2017, Print           Gwyneth Sprout, MD 01/24/17 [phone]

## 2017-01-23 NOTE — ED Notes (Signed)
Patient transported to CT 

## 2017-01-23 NOTE — ED Notes (Addendum)
Pt is alert and oriented x 4 and is c/o intermittent left sided cp that is non radiating and worsens with deep breaths, and movements.  pt describes pain as sharp. Pt also reports that she has some numbness to the left arm .

## 2017-01-23 NOTE — ED Triage Notes (Signed)
Patient c/o central chest pain that gotten worse today with movement. Patient reports going to [facility] and was sent here for further work up.

## 2017-01-23 NOTE — Discharge Instructions (Signed)
Return for fever, shortness of breath or passing out

## 2017-02-04 ENCOUNTER — Other Ambulatory Visit: Payer: Self-pay | Admitting: Physician Assistant

## 2017-02-04 DIAGNOSIS — F321 Major depressive disorder, single episode, moderate: Secondary | ICD-10-CM

## 2017-02-05 NOTE — Telephone Encounter (Signed)
My understanding is that the patient has switched her care to a psychaitrist so they should be giving this to her.  I have given her a month for her to not fun out.

## 2017-02-05 NOTE — Telephone Encounter (Signed)
Pt called to check on her refill for Pristiq.  She states that she has already contacted her pharmacy and that she has missed a dose since she has ran out of her medication.

## 2017-02-08 NOTE — Telephone Encounter (Signed)
Pharmacy advised  

## 2017-02-22 ENCOUNTER — Telehealth: Payer: Self-pay | Admitting: Emergency Medicine

## 2017-02-22 ENCOUNTER — Other Ambulatory Visit: Payer: Self-pay | Admitting: Emergency Medicine

## 2017-02-22 MED ORDER — BUPROPION HCL ER (XL) 300 MG PO TB24: 300.0000 mg | ORAL_TABLET | Freq: Every day | ORAL | 3 refills | Status: AC

## 2017-02-22 NOTE — Telephone Encounter (Signed)
Yes.  Thank you.

## 2017-02-22 NOTE — Telephone Encounter (Signed)
Can I refill this medication? Last OV 01/09/17

## 2017-02-22 NOTE — Telephone Encounter (Signed)
THIS MESSAGE IS FROM THE PHARMACIST AT [facility]: PATIENT IS REQUESTING A REFILL ON BUPROPION (WELLBUTRIN XL) 300 MG 24 HOUR TABLET. LAST REFILLED BY DR. Elvina SidleKURT LAUENSTEIN. THE PHARMACIST STATES SHE HAS SENT 2 ELECTRONIC REQUEST ON 02/19/17 AND 02/21/17. SHE HAS SENT FAX AS WELL. BEST PHONE [phone] ([facility] ON [address]) MBC

## 2017-03-05 ENCOUNTER — Other Ambulatory Visit: Payer: Self-pay | Admitting: Physician Assistant

## 2017-03-05 DIAGNOSIS — F321 Major depressive disorder, single episode, moderate: Secondary | ICD-10-CM

## 2017-03-07 NOTE — Telephone Encounter (Signed)
Will forward to Maralyn SagoSarah as she refills this med.

## 2017-03-07 NOTE — Telephone Encounter (Signed)
Please find out if she is seeing psychaitrist - if yes - then that person needs to do this for her if not let me know

## 2017-03-09 ENCOUNTER — Encounter: Payer: Self-pay | Admitting: Physician Assistant

## 2017-03-09 ENCOUNTER — Ambulatory Visit (INDEPENDENT_AMBULATORY_CARE_PROVIDER_SITE_OTHER): Payer: BLUE CROSS/BLUE SHIELD | Admitting: Physician Assistant

## 2017-03-09 VITALS — BP 134/72 | HR 76 | Temp 98.4°F | Resp 16 | Ht 62.0 in | Wt 192.0 lb

## 2017-03-09 DIAGNOSIS — F321 Major depressive disorder, single episode, moderate: Secondary | ICD-10-CM | POA: Diagnosis not present

## 2017-03-09 DIAGNOSIS — Z79899 Other long term (current) drug therapy: Secondary | ICD-10-CM

## 2017-03-09 MED ORDER — DESVENLAFAXINE SUCCINATE ER 50 MG PO TB24: 150.0000 mg | ORAL_TABLET | Freq: Every day | ORAL | 1 refills | Status: DC

## 2017-03-09 NOTE — Progress Notes (Signed)
Tracie Gonzalez  MRN: [id number] DOB: 1966/04/01  PCP: Johny Blamer, MD  Chief Complaint  Patient presents with  . Medication Refill    pristiq    Subjective:  Pt presents to clinic for medication refill.  She has seen a huge improvement since the start of pristiq - decrease tearfullness and really happy with her results.  She is wondering if she could go up because she feels like she could get better.  More desire to do things - happy with her life overall.  Not exercising but she is doing things for pleasure again around her apartment.  She finds that working only 9 hours and not 10 really decreases her feet/leg pain.  Review of Systems  Psychiatric/Behavioral: Positive for dysphoric mood. Negative for sleep disturbance. The patient is not nervous/anxious.     Patient Active Problem List   Diagnosis Date Noted  . Chronic low back pain 03/28/2016  . Depression 03/28/2016  . Hashimoto's disease 08/21/2014  . Cervicogenic headache 10/30/2013    Current Outpatient Prescriptions on File Prior to Visit  Medication Sig Dispense Refill  . amphetamine-dextroamphetamine (ADDERALL) 30 MG tablet Take 1 tablet (30 mg total) by mouth 2 (two) times daily. Do not fill before 08/02/2012 60 tablet 0  . Ascorbic Acid (VITAMIN C) 1000 MG tablet Take 1,000 mg by mouth 3 (three) times daily.    Marland Kitchen buPROPion (WELLBUTRIN XL) 300 MG 24 hr tablet Take 1 tablet (300 mg total) by mouth daily. 90 tablet 3  . etodolac (LODINE) 400 MG tablet Take 400 mg by mouth 2 (two) times daily as needed for mild pain.     . minocycline (DYNACIN) 100 MG tablet Take 100 mg by mouth daily as needed (for face breakout).     . Multiple Vitamin (MULTIVITAMIN WITH MINERALS) TABS tablet Take 1 tablet by mouth daily.    . naproxen (NAPROSYN) 375 MG tablet Take 1 tablet (375 mg total) by mouth 2 (two) times daily. 20 tablet 0  . valACYclovir (VALTREX) 1000 MG tablet TAKE 2 TABLETS TWICE A DAY FOR ACUTE FLARE (Patient taking  differently: Take 2 tablets by mouth twice daily as needed for outbreak) 40 tablet 0   No current facility-administered medications on file prior to visit.     No Known Allergies  Pt patients past, family and social history were reviewed and updated.   Objective:  BP 134/72 (BP Location: Right Arm, Patient Position: Sitting, Cuff Size: Large)   Pulse 76   Temp 98.4 F (36.9 C) (Oral)   Resp 16   Ht 5\' 2"  (1.575 m)   Wt 192 lb (87.1 kg)   SpO2 95%   BMI 35.12 kg/m   Physical Exam  Constitutional: She is oriented to person, place, and time and well-developed, well-nourished, and in no distress.  HENT:  Head: Normocephalic and atraumatic.  Right Ear: Hearing and external ear normal.  Left Ear: Hearing and external ear normal.  Eyes: Conjunctivae are normal.  Neck: Normal range of motion.  Pulmonary/Chest: Effort normal.  Neurological: She is alert and oriented to person, place, and time. Gait normal.  Skin: Skin is warm and dry.  Psychiatric: Mood, memory, affect and judgment normal.  Vitals reviewed.   Assessment and Plan :  Medication management  Moderate single current episode of major depressive disorder (HCC) - Plan: desvenlafaxine (PRISTIQ) 50 MG 24 hr tablet  Pt is doing so much better - we will increase the pristiq to 150mg  - we discussed that  after she has been on that higher dose for 4 weeks she could decrease her wellbutrin down to 150mg  to see if she see a difference prior to her reheck with me in 6 weeks.  She is a little nervous because she is so happy with her results but she will consider this change.  Benny LennertSarah Chrisangel Eskenazi PA-C  [facility] 03/09/2017 4:41 PM

## 2017-03-09 NOTE — Patient Instructions (Signed)
     IF you received an x-ray today, you will receive an invoice from [facility]. Please contact  Radiology at [phone] with questions or concerns regarding your invoice.   IF you received labwork today, you will receive an invoice from [facility]. Please contact [facility] at [phone] with questions or concerns regarding your invoice.   Our billing staff will not be able to assist you with questions regarding bills from these companies.  You will be contacted with the lab results as soon as they are available. The fastest way to get your results is to activate your My Chart account. Instructions are located on the last page of this paperwork. If you have not heard from us regarding the results in 2 weeks, please contact this office.     

## 2017-03-28 ENCOUNTER — Other Ambulatory Visit: Payer: Self-pay | Admitting: Physician Assistant

## 2018-01-18 NOTE — Telephone Encounter (Signed)
erroneous

## 2018-01-23 ENCOUNTER — Encounter: Payer: Self-pay | Admitting: Physician Assistant

## 2018-06-20 ENCOUNTER — Emergency Department (HOSPITAL_COMMUNITY): Payer: Managed Care, Other (non HMO)

## 2018-06-20 ENCOUNTER — Emergency Department (HOSPITAL_COMMUNITY)
Admission: EM | Admit: 2018-06-20 | Discharge: 2018-06-21 | Disposition: A | Discharge status: Home / Self Care | Payer: Managed Care, Other (non HMO) | Attending: Emergency Medicine | Admitting: Emergency Medicine

## 2018-06-20 ENCOUNTER — Other Ambulatory Visit: Payer: Self-pay

## 2018-06-20 ENCOUNTER — Encounter (HOSPITAL_COMMUNITY): Payer: Self-pay

## 2018-06-20 DIAGNOSIS — S161XXA Strain of muscle, fascia and tendon at neck level, initial encounter: Secondary | ICD-10-CM | POA: Diagnosis not present

## 2018-06-20 DIAGNOSIS — M79601 Pain in right arm: Secondary | ICD-10-CM | POA: Diagnosis not present

## 2018-06-20 DIAGNOSIS — Y999 Unspecified external cause status: Secondary | ICD-10-CM | POA: Diagnosis not present

## 2018-06-20 DIAGNOSIS — R102 Pelvic and perineal pain: Secondary | ICD-10-CM | POA: Diagnosis not present

## 2018-06-20 DIAGNOSIS — T07XXXA Unspecified multiple injuries, initial encounter: Secondary | ICD-10-CM | POA: Diagnosis present

## 2018-06-20 DIAGNOSIS — M79604 Pain in right leg: Secondary | ICD-10-CM | POA: Insufficient documentation

## 2018-06-20 DIAGNOSIS — Y929 Unspecified place or not applicable: Secondary | ICD-10-CM | POA: Insufficient documentation

## 2018-06-20 DIAGNOSIS — R51 Headache: Secondary | ICD-10-CM | POA: Diagnosis not present

## 2018-06-20 DIAGNOSIS — M79605 Pain in left leg: Secondary | ICD-10-CM | POA: Diagnosis not present

## 2018-06-20 DIAGNOSIS — M79602 Pain in left arm: Secondary | ICD-10-CM | POA: Diagnosis not present

## 2018-06-20 DIAGNOSIS — Y9301 Activity, walking, marching and hiking: Secondary | ICD-10-CM | POA: Insufficient documentation

## 2018-06-20 DIAGNOSIS — M545 Low back pain: Secondary | ICD-10-CM | POA: Insufficient documentation

## 2018-06-20 MED ORDER — ACETAMINOPHEN 325 MG PO TABS
650.0000 mg | ORAL_TABLET | Freq: Once | ORAL | Status: AC
  Administered 2018-06-21: 650 mg via ORAL
  Filled 2018-06-20: qty 2

## 2018-06-20 NOTE — ED Notes (Signed)
Bed: WTR7 Expected date:  Expected time:  Means of arrival:  Comments: 
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
## 2018-06-20 NOTE — ED Notes (Signed)
Bed: WA02 Expected date:  Expected time:  Means of arrival:  Comments: 

## 2018-06-21 ENCOUNTER — Emergency Department (HOSPITAL_COMMUNITY): Payer: Managed Care, Other (non HMO)

## 2018-06-21 DIAGNOSIS — S161XXA Strain of muscle, fascia and tendon at neck level, initial encounter: Secondary | ICD-10-CM | POA: Diagnosis not present

## 2018-06-21 MED ORDER — IBUPROFEN 600 MG PO TABS: 600.0000 mg | ORAL_TABLET | Freq: Four times a day (QID) | ORAL | 0 refills | Status: DC | PRN

## 2018-06-21 MED ORDER — ACETAMINOPHEN ER 650 MG PO TBCR: 650.0000 mg | EXTENDED_RELEASE_TABLET | Freq: Three times a day (TID) | ORAL | 0 refills | Status: DC | PRN

## 2018-06-21 MED ORDER — IBUPROFEN 200 MG PO TABS
600.0000 mg | ORAL_TABLET | Freq: Once | ORAL | Status: AC
  Administered 2018-06-21: 600 mg via ORAL
  Filled 2018-06-21: qty 3
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
## 2018-10-01 ENCOUNTER — Ambulatory Visit: Payer: 59 | Admitting: Neurology

## 2018-10-01 ENCOUNTER — Encounter

## 2018-10-01 ENCOUNTER — Encounter: Payer: Self-pay | Admitting: Neurology

## 2018-10-01 VITALS — BP 135/81 | HR 78 | Ht 62.0 in | Wt 186.5 lb

## 2018-10-01 DIAGNOSIS — R202 Paresthesia of skin: Secondary | ICD-10-CM | POA: Insufficient documentation

## 2018-10-01 DIAGNOSIS — M549 Dorsalgia, unspecified: Secondary | ICD-10-CM

## 2018-10-01 MED ORDER — GABAPENTIN 100 MG PO CAPS: 100.0000 mg | ORAL_CAPSULE | Freq: Three times a day (TID) | ORAL | 6 refills | Status: AC
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
## 2018-10-01 NOTE — Patient Instructions (Signed)
[facility]    Address: [address], [place], [place] [address]  Phone: [phone]   

## 2018-11-08 ENCOUNTER — Telehealth: Payer: Self-pay | Admitting: Neurology

## 2018-11-08 ENCOUNTER — Ambulatory Visit (INDEPENDENT_AMBULATORY_CARE_PROVIDER_SITE_OTHER): Payer: 59 | Admitting: Neurology

## 2018-11-08 ENCOUNTER — Ambulatory Visit: Payer: 59 | Admitting: Neurology

## 2018-11-08 DIAGNOSIS — M549 Dorsalgia, unspecified: Secondary | ICD-10-CM

## 2018-11-08 DIAGNOSIS — M542 Cervicalgia: Secondary | ICD-10-CM | POA: Diagnosis not present

## 2018-11-08 DIAGNOSIS — G5603 Carpal tunnel syndrome, bilateral upper limbs: Secondary | ICD-10-CM

## 2018-11-08 DIAGNOSIS — R202 Paresthesia of skin: Secondary | ICD-10-CM | POA: Diagnosis not present

## 2018-11-08 NOTE — Telephone Encounter (Signed)
[employer] order sent to [facility]. They will obtain the auth and reach out to the pt to schedule.  °

## 2018-11-08 NOTE — Procedures (Signed)
Full Name: Tracie Gonzalez Gender: Female MRN #: [id number] Date of Birth: Jun 13, 1966    Visit Date: 11/08/2018 08:24 Age: 53 Years 0 Months Old Examining Physician: Levert Feinstein, MD  Referring Physician: Levert Feinstein, MD History: 53 year old female, presented with bilateral hands paresthesia, left worse than right.  Summary of the tests:  Nerve conduction study: Bilateral median sensory responses showed mildly prolonged peak latency, with mildly decreased snap amplitude.  Bilateral ulnar, sensory and motor responses were normal.  Left sural, superficial peroneal sensory responses were normal.  Left peroneal to EDB and tibial motor responses were normal.  Right median motor responses were normal.  Left median motor responses showed moderately prolonged distal latency, with mildly decreased the C map amplitude.  Electromyography: Selected needle examination of left upper and lower extremity muscles, left cervical and lumbosacral paraspinal muscles were normal.  Conclusion: This is an abnormal study.  There is electrodiagnostic evidence of bilateral median neuropathy across the wrist, consistent with bilateral carpal tunnel syndromes, left side is moderate, left side is mild; there is no evidence of left cervical radiculopathy or left lumbosacral radiculopathy.    ------------------------------- Levert Feinstein, M.D. PhD  [facility] [address] [place], [place] [address] Tel: [phone] Fax: [phone]        [facility]    Nerve / Sites Muscle Latency Ref. Amplitude Ref. Rel Amp Segments Distance Velocity Ref. Area    ms ms mV mV %  cm m/s m/s mVms  L Median - APB     Wrist APB 5.8 ?4.4 3.6 ?4.0 100 Wrist - APB 7   12.8     Upper arm APB 9.8  3.5  97 Upper arm - Wrist 20 51 ?49 12.4  R Median - APB     Wrist APB 4.3 ?4.4 9.9 ?4.0 100 Wrist - APB 7   34.3     Upper arm APB 8.1  9.6  96.7 Upper arm - Wrist 20 53 ?49 33.9  L Ulnar - ADM     Wrist ADM 2.3 ?3.3 11.1  ?6.0 100 Wrist - ADM 7   33.1     B.Elbow ADM 4.9  10.1  91.1 B.Elbow - Wrist 16 61 ?49 31.5     A.Elbow ADM 6.7  9.7  95.5 A.Elbow - B.Elbow 10 55 ?49 34.0         A.Elbow - Wrist      R Ulnar - ADM     Wrist ADM 2.6 ?3.3 11.4 ?6.0 100 Wrist - ADM 7   32.7     B.Elbow ADM 5.1  9.9  86.4 B.Elbow - Wrist 16 65 ?49 31.1     A.Elbow ADM 6.9  10.0  101 A.Elbow - B.Elbow 10 55 ?49 30.5         A.Elbow - Wrist      L Peroneal - EDB     Ankle EDB 5.3 ?6.5 5.1 ?2.0 100 Ankle - EDB 9   17.9     Fib head EDB 10.9  4.7  92.3 Fib head - Ankle 29 51 ?44 17.4     Pop fossa EDB 13.1  4.6  97.3 Pop fossa - Fib head 10 46 ?44 17.1         Pop fossa - Ankle      L Tibial - AH     Ankle AH 4.8 ?5.8 4.7 ?4.0 100 Ankle - AH 9   13.8     Pop  fossa AH 11.5  5.2  110 Pop fossa - Ankle 32 48 ?41 14.9                  SNC    Nerve / Sites Rec. Site Peak Lat Ref.  Amp Ref. Segments Distance    ms ms V V  cm  L Sural - Ankle (Calf)     Calf Ankle 3.7 ?4.4 11 ?6 Calf - Ankle 14  L Superficial peroneal - Ankle     Lat leg Ankle 4.0 ?4.4 7 ?6 Lat leg - Ankle 14  L Median - Orthodromic (Dig II, Mid palm)     Dig II Wrist 4.1 ?3.4 7 ?10 Dig II - Wrist 13  R Median - Orthodromic (Dig II, Mid palm)     Dig II Wrist 4.1 ?3.4 9 ?10 Dig II - Wrist 13  L Ulnar - Orthodromic, (Dig V, Mid palm)     Dig V Wrist 2.5 ?3.1 8 ?5 Dig V - Wrist 11  R Ulnar - Orthodromic, (Dig V, Mid palm)     Dig V Wrist 2.6 ?3.1 9 ?5 Dig V - Wrist 37                  F  Wave    Nerve F Lat Ref.   ms ms  L Ulnar - ADM 26.0 ?32.0  R Ulnar - ADM 25.8 ?32.0  L Tibial - AH 48.6 ?56.0           EMG full       EMG Summary Table    Spontaneous MUAP Recruitment  Muscle IA Fib PSW Fasc Other Amp Dur. Poly Pattern  L. Tibialis anterior Normal None None None _______ Normal Normal Normal Normal  L. First dorsal interosseous Normal None None None _______ Normal Normal Normal Normal  L. Pronator teres Normal None None None _______ Normal  Normal Normal Normal  L. Biceps brachii Normal None None None _______ Normal Normal Normal Normal  L. Deltoid Normal None None None _______ Normal Normal Normal Normal  L. Triceps brachii Normal None None None _______ Normal Normal Normal Normal  L. Tibialis posterior Normal None None None _______ Normal Normal Normal Normal  L. Peroneus longus Normal None None None _______ Normal Normal Normal Normal  L. Gastrocnemius (Medial head) Normal None None None _______ Normal Normal Normal Normal  L. Vastus lateralis Normal None None None _______ Normal Normal Normal Normal  L. Lumbar paraspinals (mid) Normal None None None _______ Normal Normal Normal Normal  L. Lumbar paraspinals (low) Normal None None None _______ Normal Normal Normal Normal  L. Cervical paraspinals Normal None None None _______ Normal Normal Normal Normal
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
